# Patient Record
Sex: Male | Born: 1995 | Race: White | Hispanic: No | Marital: Married | Smoking: Current every day smoker
Health system: Southern US, Community
[De-identification: ages and names within clinical notes are randomized; demographics above are authoritative.]

## PROBLEM LIST (undated history)

## (undated) HISTORY — PX: TONSILLECTOMY AND ADENOIDECTOMY: SUR1326

## (undated) HISTORY — PX: TONSILLECTOMY: SUR1361

---
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
## 2017-03-05 ENCOUNTER — Encounter (HOSPITAL_COMMUNITY): Payer: Self-pay | Admitting: *Deleted

## 2017-03-05 ENCOUNTER — Emergency Department (HOSPITAL_COMMUNITY)
Admission: EM | Admit: 2017-03-05 | Discharge: 2017-03-05 | Disposition: A | Discharge status: Home / Self Care | Payer: Self-pay | Attending: Emergency Medicine | Admitting: Emergency Medicine

## 2017-03-05 ENCOUNTER — Emergency Department (HOSPITAL_COMMUNITY): Payer: Self-pay

## 2017-03-05 DIAGNOSIS — J029 Acute pharyngitis, unspecified: Secondary | ICD-10-CM | POA: Insufficient documentation

## 2017-03-05 DIAGNOSIS — J3489 Other specified disorders of nose and nasal sinuses: Secondary | ICD-10-CM | POA: Insufficient documentation

## 2017-03-05 DIAGNOSIS — R509 Fever, unspecified: Secondary | ICD-10-CM | POA: Insufficient documentation

## 2017-03-05 DIAGNOSIS — R05 Cough: Secondary | ICD-10-CM | POA: Insufficient documentation

## 2017-03-05 DIAGNOSIS — J111 Influenza due to unidentified influenza virus with other respiratory manifestations: Secondary | ICD-10-CM

## 2017-03-05 DIAGNOSIS — R69 Illness, unspecified: Secondary | ICD-10-CM | POA: Insufficient documentation

## 2017-03-05 DIAGNOSIS — R51 Headache: Secondary | ICD-10-CM | POA: Insufficient documentation

## 2017-03-05 DIAGNOSIS — F17220 Nicotine dependence, chewing tobacco, uncomplicated: Secondary | ICD-10-CM | POA: Insufficient documentation

## 2017-03-05 DIAGNOSIS — R42 Dizziness and giddiness: Secondary | ICD-10-CM | POA: Insufficient documentation

## 2017-03-05 LAB — RAPID STREP SCREEN (MED CTR MEBANE ONLY): Streptococcus, Group A Screen (Direct): NEGATIVE

## 2017-03-05 MED ORDER — IBUPROFEN 400 MG PO TABS
400.0000 mg | ORAL_TABLET | Freq: Once | ORAL | Status: AC
  Administered 2017-03-05: 400 mg via ORAL
  Filled 2017-03-05: qty 1

## 2017-03-05 NOTE — ED Triage Notes (Signed)
Pt c/o generalized body aches with right ear pain and dizziness x 3 days

## 2017-03-05 NOTE — ED Provider Notes (Signed)
[facility]-EMERGENCY DEPT Provider Note   CSN: [id number] Arrival date & time: 03/05/17  0015     History   Chief Complaint Chief Complaint  Patient presents with  . Generalized Body Aches    HPI Marc Porter is a 21 y.o. male.  Patient presents with a three-day history of generalized body aches, right ear pain, lightheadedness, headache, sore throat, rhinorrhea and cough. Has had sick contacts at home. Tmax Was reportedly 102. Denies any vomiting or diarrhea. Has not taken any medications at home. Denies any chronic medical conditions. Did not receive a flu shot this season. At this time feels pressure in his right ear with generalized congestion, sore throat and runny nose. Denies any chest pain, abdominal pain, nausea or vomiting.   The history is provided by the patient.    History reviewed. No pertinent past medical history.  There are no active problems to display for this patient.   Past Surgical History:  Procedure Laterality Date  . TONSILLECTOMY AND ADENOIDECTOMY         Home Medications    Prior to Admission medications   Not on File    Family History History reviewed. No pertinent family history.  Social History Social History  Substance Use Topics  . Smoking status: Light Tobacco Smoker  . Smokeless tobacco: Current User    Types: Chew  . Alcohol use Yes     Comment: occasionally     Allergies   Patient has no known allergies.   Review of Systems Review of Systems  Constitutional: Positive for activity change, appetite change, chills and fever.  HENT: Positive for congestion and rhinorrhea.   Respiratory: Positive for cough. Negative for chest tightness and shortness of breath.   Cardiovascular: Negative for chest pain.  Gastrointestinal: Negative for abdominal pain and nausea.  Genitourinary: Negative for dysuria, hematuria and testicular pain.  Musculoskeletal: Positive for arthralgias and myalgias.  Skin: Negative for wound.    Neurological: Positive for dizziness, weakness, light-headedness and headaches.    all other systems are negative except as noted in the HPI and PMH.    Physical Exam Updated Vital Signs BP 133/82 (BP Location: Right Arm)   Pulse (!) 106   Temp 98.7 F (37.1 C) (Oral)   Resp 20   Ht  (1.905 m)   Wt 79.4 kg (175 lb)   SpO2 100%   BMI 21.87 kg/m   Physical Exam  Constitutional: He is oriented to person, place, and time. He appears well-developed and well-nourished. No distress.  Nontoxic appearing  HENT:  Head: Normocephalic and atraumatic.  Mouth/Throat: Oropharynx is clear and moist. No oropharyngeal exudate.  Serous fluid behind TMs bilaterally  Eyes: Pupils are equal, round, and reactive to light. Conjunctivae and EOM are normal.  Neck: Normal range of motion. Neck supple.  No meningismus.  Cardiovascular: Normal rate, regular rhythm, normal heart sounds and intact distal pulses.   No murmur heard. Pulmonary/Chest: Effort normal and breath sounds normal. No respiratory distress. He exhibits no tenderness.  Abdominal: Soft. There is no tenderness. There is no rebound and no guarding.  Musculoskeletal: Normal range of motion. He exhibits no edema or tenderness.  Neurological: He is alert and oriented to person, place, and time. No cranial nerve deficit. He exhibits normal muscle tone. Coordination normal.   5/5 strength throughout. CN 2-12 intact.Equal grip strength.   Skin: Skin is warm.  Psychiatric: He has a normal mood and affect. His behavior is normal.  Nursing note and  vitals reviewed.    ED Treatments / Results  Labs (all labs ordered are listed, but only abnormal results are displayed) Labs Reviewed  RAPID STREP SCREEN (NOT AT [facility])  CULTURE, GROUP A STREP [facility])    EKG  EKG Interpretation None       Radiology Dg Chest 2 View  Result Date: 03/05/2017 CLINICAL DATA:  Generalized body aches.  Lightheadedness for 3 days. EXAM: CHEST  2 VIEW  COMPARISON:  None. FINDINGS: The lungs are clear. The pulmonary vasculature is normal. Heart size is normal. Hilar and mediastinal contours are unremarkable. There is no pleural effusion. IMPRESSION: No active cardiopulmonary disease. Electronically Signed   By: Ellery Plunk M.D.   On: 03/05/2017 01:39    Procedures Procedures (including critical care time)  Medications Ordered in ED Medications  ibuprofen (ADVIL,MOTRIN) tablet 400 mg (not administered)     Initial Impression / Assessment and Plan / ED Course  I have reviewed the triage vital signs and the nursing notes.  Pertinent labs & imaging results that were available during my care of the patient were reviewed by me and considered in my medical decision making (see chart for details).    Patient with 3 day history of right ear pain, generalized body aches, headache, sore throat, lightheadedness and rhinorrhea. Also with cough. Has had sick contacts at home  Patient appears nontoxic. He is afebrile. Lungs are clear. Oropharynx is clear. CXR negative. Rapid strep negative.  Suspect viral syndrome, possibly influenza. Discussed risks and benefits of Tamiflu which patient declines.  Discussed by mouth hydration at home, antipyretics, PCP follow-up, return precautions discussed  Final Clinical Impressions(s) / ED Diagnoses   Final diagnoses:  Influenza-like illness    New Prescriptions New Prescriptions   No medications on file     Glynn Octave, MD 03/05/17 [phone]

## 2017-03-07 LAB — CULTURE, GROUP A STREP (THRC)

## 2017-04-27 ENCOUNTER — Emergency Department (HOSPITAL_COMMUNITY)
Admission: EM | Admit: 2017-04-27 | Discharge: 2017-04-27 | Disposition: A | Discharge status: Home / Self Care | Payer: Self-pay | Attending: Emergency Medicine | Admitting: Emergency Medicine

## 2017-04-27 ENCOUNTER — Encounter (HOSPITAL_COMMUNITY): Payer: Self-pay | Admitting: Emergency Medicine

## 2017-04-27 DIAGNOSIS — X58XXXA Exposure to other specified factors, initial encounter: Secondary | ICD-10-CM | POA: Insufficient documentation

## 2017-04-27 DIAGNOSIS — S30812A Abrasion of penis, initial encounter: Secondary | ICD-10-CM | POA: Insufficient documentation

## 2017-04-27 DIAGNOSIS — Y929 Unspecified place or not applicable: Secondary | ICD-10-CM | POA: Insufficient documentation

## 2017-04-27 DIAGNOSIS — F17290 Nicotine dependence, other tobacco product, uncomplicated: Secondary | ICD-10-CM | POA: Insufficient documentation

## 2017-04-27 DIAGNOSIS — Y9389 Activity, other specified: Secondary | ICD-10-CM | POA: Insufficient documentation

## 2017-04-27 DIAGNOSIS — Y999 Unspecified external cause status: Secondary | ICD-10-CM | POA: Insufficient documentation

## 2017-04-27 NOTE — ED Notes (Signed)
Pt ambulatory to waiting room. Pt verbalized understanding of discharge instructions.   

## 2017-04-27 NOTE — Discharge Instructions (Signed)
Put antibiotic ointment on the abrasion, recheck if it gets infected (drains pus, gets more red or swollen), if you are unable to urinate.  No sex for a couple of days, and then use condoms.

## 2017-04-27 NOTE — ED Provider Notes (Signed)
[facility] EMERGENCY DEPARTMENT Provider Note   CSN: [id number] Arrival date & time: 04/27/17  0121  Time seen 02:08 AM   History   Chief Complaint Chief Complaint  Patient presents with  . Penis Pain    HPI Marc Porter is a 21 y.o. male.  HPI patient is here with his girlfriend.  She states last night he had some discomfort when they were having sex and he had a small abrasion on the side of his penis.  Tonight about midnight they were having sex and the abrasion got worse.  He states he had a little bleeding and it is improved.  His girlfriend states the penis is now swollen.  He states he was able to urinate without difficulty afterwards.  Denies having any discomfort in his penis prior to having sexual contact.  PCP none   History reviewed. No pertinent past medical history.  There are no active problems to display for this patient.   Past Surgical History:  Procedure Laterality Date  . TONSILLECTOMY AND ADENOIDECTOMY         Home Medications    Prior to Admission medications   Not on File    Family History No family history on file.  Social History Social History   Tobacco Use  . Smoking status: Light Tobacco Smoker  . Smokeless tobacco: Current User    Types: Chew  Substance Use Topics  . Alcohol use: Yes    Comment: occasionally  . Drug use: No     Allergies   Patient has no known allergies.   Review of Systems Review of Systems  All other systems reviewed and are negative.    Physical Exam Updated Vital Signs BP 139/86 (BP Location: Left Arm)   Pulse (!) 101   Temp 98 F (36.7 C) (Oral)   Resp 17   Ht 6\' 3"  (1.905 m)   Wt 81.6 kg (180 lb)   SpO2 100%   BMI 22.50 kg/m   Vital signs normal except for tachycardia   Physical Exam  Constitutional: He appears well-developed and well-nourished. No distress.  HENT:  Head: Normocephalic and atraumatic.  Right Ear: External ear normal.  Left Ear: External ear normal.    Nose: Nose normal.  Eyes: Conjunctivae and EOM are normal.  Neck: Normal range of motion.  Cardiovascular: Normal rate.  Pulmonary/Chest: Effort normal. No respiratory distress.  Genitourinary:  Genitourinary Comments: Patient is circumcised, he is noted to have a very superficial abrasion on the left side of his shaft just proximal to the head that is approximately 1 cm in length.  There is no active bleeding.  His penis is straight and without deformity or swelling.  Nursing note and vitals reviewed.    ED Treatments / Results  Labs (all labs ordered are listed, but only abnormal results are displayed) Labs Reviewed - No data to display  EKG  EKG Interpretation None       Radiology No results found.  Procedures Procedures (including critical care time)  Medications Ordered in ED Medications - No data to display   Initial Impression / Assessment and Plan / ED Course  I have reviewed the triage vital signs and the nursing notes.  Pertinent labs & imaging results that were available during my care of the patient were reviewed by me and considered in my medical decision making (see chart for details).    Patient has a superficial abrasion to the shaft of his penis.  I was concerned  he may have had some underlying herpes however he had no discomfort until he had the active sexual intercourse.  He has a superficial abrasion that should heal with just local care.  Final Clinical Impressions(s) / ED Diagnoses   Final diagnoses:  Abrasion of penis, initial encounter    ED Discharge Orders    None    antibiotic ointment  Plan discharge  Devoria AlbeIva Kaytie Ratcliffe, MD, Concha PyoFACEP    Shawntae Lowy, MD 04/27/17 [phone]

## 2017-04-27 NOTE — ED Triage Notes (Signed)
Pt states he was having sex with his girlfriend and think he ripped his foreskin.

## 2018-06-11 ENCOUNTER — Encounter (HOSPITAL_COMMUNITY): Payer: Self-pay | Admitting: Emergency Medicine

## 2018-06-11 ENCOUNTER — Other Ambulatory Visit: Payer: Self-pay

## 2018-06-11 ENCOUNTER — Emergency Department (HOSPITAL_COMMUNITY): Payer: Self-pay

## 2018-06-11 ENCOUNTER — Emergency Department (HOSPITAL_COMMUNITY)
Admission: EM | Admit: 2018-06-11 | Discharge: 2018-06-11 | Disposition: A | Discharge status: Home / Self Care | Payer: Self-pay | Attending: Emergency Medicine | Admitting: Emergency Medicine

## 2018-06-11 DIAGNOSIS — M545 Low back pain, unspecified: Secondary | ICD-10-CM

## 2018-06-11 DIAGNOSIS — Z720 Tobacco use: Secondary | ICD-10-CM | POA: Insufficient documentation

## 2018-06-11 LAB — CBC WITH DIFFERENTIAL/PLATELET
Abs Immature Granulocytes: 0.01 10*3/uL (ref 0.00–0.07)
BASOS ABS: 0 10*3/uL (ref 0.0–0.1)
Basophils Relative: 0 %
EOS ABS: 0.1 10*3/uL (ref 0.0–0.5)
EOS PCT: 1 %
HCT: 42.8 % (ref 39.0–52.0)
Hemoglobin: 14.5 g/dL (ref 13.0–17.0)
Immature Granulocytes: 0 %
Lymphocytes Relative: 42 %
Lymphs Abs: 1.9 10*3/uL (ref 0.7–4.0)
MCH: 29.2 pg (ref 26.0–34.0)
MCHC: 33.9 g/dL (ref 30.0–36.0)
MCV: 86.1 fL (ref 80.0–100.0)
Monocytes Absolute: 0.4 10*3/uL (ref 0.1–1.0)
Monocytes Relative: 10 %
NEUTROS PCT: 47 %
NRBC: 0 % (ref 0.0–0.2)
Neutro Abs: 2.2 10*3/uL (ref 1.7–7.7)
PLATELETS: 229 10*3/uL (ref 150–400)
RBC: 4.97 MIL/uL (ref 4.22–5.81)
RDW: 12.1 % (ref 11.5–15.5)
WBC: 4.7 10*3/uL (ref 4.0–10.5)

## 2018-06-11 LAB — COMPREHENSIVE METABOLIC PANEL
ALBUMIN: 4.4 g/dL (ref 3.5–5.0)
ALT: 35 U/L (ref 0–44)
ANION GAP: 11 (ref 5–15)
AST: 28 U/L (ref 15–41)
Alkaline Phosphatase: 77 U/L (ref 38–126)
BILIRUBIN TOTAL: 0.6 mg/dL (ref 0.3–1.2)
BUN: 12 mg/dL (ref 6–20)
CHLORIDE: 108 mmol/L (ref 98–111)
CO2: 21 mmol/L — AB (ref 22–32)
Calcium: 9.4 mg/dL (ref 8.9–10.3)
Creatinine, Ser: 0.85 mg/dL (ref 0.61–1.24)
GFR calc Af Amer: >60 mL/min (ref >60)
GFR calc non Af Amer: >60 mL/min (ref >60)
GLUCOSE: 126 mg/dL — AB (ref 70–99)
POTASSIUM: 3.3 mmol/L — AB (ref 3.5–5.1)
SODIUM: 140 mmol/L (ref 135–145)
TOTAL PROTEIN: 7.1 g/dL (ref 6.5–8.1)

## 2018-06-11 LAB — LIPASE, BLOOD: Lipase: 19 U/L (ref 11–51)

## 2018-06-11 LAB — URINALYSIS, ROUTINE W REFLEX MICROSCOPIC
BILIRUBIN URINE: NEGATIVE
Glucose, UA: NEGATIVE mg/dL
Hgb urine dipstick: NEGATIVE
KETONES UR: NEGATIVE mg/dL
LEUKOCYTES UA: NEGATIVE
NITRITE: NEGATIVE
PH: 6 (ref 5.0–8.0)
PROTEIN: NEGATIVE mg/dL
Specific Gravity, Urine: 1.027 (ref 1.005–1.030)

## 2018-06-11 MED ORDER — MORPHINE SULFATE (PF) 4 MG/ML IV SOLN
4.0000 mg | Freq: Once | INTRAVENOUS | Status: AC
  Administered 2018-06-11: 4 mg via INTRAVENOUS
  Filled 2018-06-11: qty 1

## 2018-06-11 MED ORDER — POTASSIUM CHLORIDE CRYS ER 20 MEQ PO TBCR
20.0000 meq | EXTENDED_RELEASE_TABLET | Freq: Once | ORAL | Status: AC
  Administered 2018-06-11: 20 meq via ORAL
  Filled 2018-06-11: qty 1

## 2018-06-11 MED ORDER — TRAMADOL HCL 50 MG PO TABS: 50.0000 mg | ORAL_TABLET | Freq: Four times a day (QID) | ORAL | 0 refills | Status: DC | PRN

## 2018-06-11 MED ORDER — IBUPROFEN 600 MG PO TABS: 600.0000 mg | ORAL_TABLET | Freq: Four times a day (QID) | ORAL | 0 refills | Status: DC | PRN

## 2018-06-11 MED ORDER — CYCLOBENZAPRINE HCL 10 MG PO TABS: 10.0000 mg | ORAL_TABLET | Freq: Two times a day (BID) | ORAL | 0 refills | Status: DC | PRN

## 2018-06-11 MED ORDER — ONDANSETRON HCL 4 MG/2ML IJ SOLN
4.0000 mg | Freq: Once | INTRAMUSCULAR | Status: AC
  Administered 2018-06-11: 4 mg via INTRAVENOUS
  Filled 2018-06-11: qty 2

## 2018-06-11 NOTE — ED Notes (Signed)
Pt returned from xray

## 2018-06-11 NOTE — Discharge Instructions (Addendum)
Take the medicines as directed.  Do not drive within 4 hours of taking tramadol or flexeril (muscle relaxer) as these will make you drowsy.  Avoid lifting,  Bending,  Twisting or any other activity that worsens your pain over the next week.  Apply a heating pad to your lower back for 20 minutes 3-4 times daily.  You should get rechecked if your symptoms are not better over the next 5 days,  Or you develop increased pain,  Weakness in your leg(s) or loss of bladder or bowel function - these are symptoms of a worsening condition.  Your Lumbar xrays are normal in appearance without obvious chronic injury findings.

## 2018-06-11 NOTE — ED Notes (Signed)
Patient transported to X-ray 

## 2018-06-11 NOTE — ED Provider Notes (Signed)
[facility] EMERGENCY DEPARTMENT Provider Note   CSN: [id number] Arrival date & time: 06/11/18  4098     History   Chief Complaint Chief Complaint  Patient presents with  . Back Pain    HPI Marc Porter is a 23 y.o. male with no significant past medical history presenting with sudden onset of right mid flank side which wraps around to his anterior abdomen.  The episode started acutely this morning.  He states he was sitting on his couch and reached for a potato chip when the pain started which is severe and constant and is associated with nausea but no emesis.  He reports lifting a riding lawn more yesterday and may have injured his back, but denies having any pain prior to this morning.  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  He has never been evaluated for his back issues.  He denies weakness or numbness in his legs, denies urinary or fecal incontinence or retention.  His pain today is definitely worsened with movement but is present at rest as well.  His pain is now generalized to his abdomen but is more pronounced in his lower mid abdomen.  No abdominal surgical history.  Denies history of kidney stones.  Has had no dysuria or hematuria, denies vomiting or diarrhea.  Also denies chest pain, shortness of breath or pleuritic pain.  He has had no medications prior to arrival.  The history is provided by the patient.    History reviewed. No pertinent past medical history.  There are no active problems to display for this patient.   Past Surgical History:  Procedure Laterality Date  . TONSILLECTOMY AND ADENOIDECTOMY          Home Medications    Prior to Admission medications   Medication Sig Start Date End Date Taking? Authorizing Provider  cyclobenzaprine (FLEXERIL) 10 MG tablet Take 1 tablet (10 mg total) by mouth 2 (two) times daily as needed for muscle spasms. 06/11/18   Burgess Amor, PA-C  ibuprofen (ADVIL,MOTRIN) 600 MG tablet Take 1 tablet (600 mg total) by mouth every 6 (six) hours as needed. 06/11/18   Burgess Amor, PA-C  traMADol (ULTRAM) 50 MG tablet Take 1 tablet (50 mg total) by mouth every 6 (six) hours as needed. 06/11/18   Burgess Amor, PA-C    Family History History reviewed. No pertinent family history.  Social History Social History   Tobacco Use  . Smoking status: Light Tobacco Smoker  . Smokeless tobacco: Current User    Types: Chew  Substance Use Topics  . Alcohol use: Yes    Comment: occasionally  . Drug use: No     Allergies   Patient has no known allergies.   Review of Systems Review of Systems  Constitutional: Positive for diaphoresis. Negative for fever.  HENT: Negative for congestion and sore throat.   Eyes: Negative.   Respiratory: Negative for chest tightness and shortness of breath.   Cardiovascular: Negative for chest pain.  Gastrointestinal: Positive for abdominal pain and nausea. Negative for constipation, diarrhea and vomiting.       Flank pain  Genitourinary: Negative.   Musculoskeletal: Positive for back pain. Negative for arthralgias, joint swelling and neck pain.  Skin: Negative.  Negative for rash and wound.  Neurological: Negative for dizziness, weakness, light-headedness, numbness and headaches.  Psychiatric/Behavioral: Negative.      Physical Exam Updated  Vital Signs BP 116/69 (BP Location: Right Arm)   Pulse 79   Temp 98.3 F (36.8 C) (Oral)   Resp 15   Ht 6\' 1"  (1.854 m)   Wt 93 kg   SpO2 100%   BMI 27.05 kg/m   Physical Exam Vitals signs and nursing note reviewed.  Constitutional:      Appearance: He is well-developed.     Comments: Appears pale.  No diaphoresis.  HENT:     Head: Normocephalic and atraumatic.  Eyes:     Conjunctiva/sclera: Conjunctivae normal.  Neck:     Musculoskeletal: Normal range of motion.  Cardiovascular:     Rate and Rhythm: Normal rate and regular rhythm.      Heart sounds: Normal heart sounds.  Pulmonary:     Effort: Pulmonary effort is normal.     Breath sounds: Normal breath sounds. No wheezing.  Abdominal:     General: Bowel sounds are normal.     Palpations: Abdomen is soft.     Tenderness: There is generalized abdominal tenderness. There is no right CVA tenderness, left CVA tenderness, guarding or rebound. Negative signs include Murphy's sign.     Comments: Patient with generalized abdominal pain and right lateral side pain to palpation.  There is no guarding or rebound.  No specific CVA tenderness.  No distention, normal bowel sounds.  Musculoskeletal: Normal range of motion.     Thoracic back: He exhibits no bony tenderness.     Lumbar back: He exhibits no bony tenderness.  Skin:    General: Skin is warm and dry.  Neurological:     Mental Status: He is alert.      ED Treatments / Results  Labs (all labs ordered are listed, but only abnormal results are displayed) Labs Reviewed  COMPREHENSIVE METABOLIC PANEL - Abnormal; Notable for the following components:      Result Value   Potassium 3.3 (*)    CO2 21 (*)    Glucose, Bld 126 (*)    All other components within normal limits  CBC WITH DIFFERENTIAL/PLATELET  LIPASE, BLOOD  URINALYSIS, ROUTINE W REFLEX MICROSCOPIC    EKG None  Radiology Dg Lumbar Spine Complete  Result Date: 06/11/2018 CLINICAL DATA:  Lumbar pain EXAM: LUMBAR SPINE - COMPLETE 4+ VIEW COMPARISON:  None. FINDINGS: There is no evidence of lumbar spine fracture. Alignment is normal. Intervertebral disc spaces are maintained. IMPRESSION: Negative. Electronically Signed   By: Elige KoHetal  Patel   On: 06/11/2018 11:28    Procedures Procedures (including critical care time)  Medications Ordered in ED Medications  ondansetron (ZOFRAN) injection 4 mg (4 mg Intravenous Given 06/11/18 0856)  morphine 4 MG/ML injection 4 mg (4 mg Intravenous Given 06/11/18 0857)  potassium chloride SA (K-DUR,KLOR-CON) CR tablet 20 mEq  (20 mEq Oral Given 06/11/18 1040)     Initial Impression / Assessment and Plan / ED Course  I have reviewed the triage vital signs and the nursing notes.  Pertinent labs & imaging results that were available during my care of the patient were reviewed by me and considered in my medical decision making (see chart for details).     Labs reviewed and are negative for any obvious intra-abdominal process including a normal urinalysis without hemoglobin, doubt that this is a ureteral stone, especially given reproducible symptoms.  He was given IV fluids along with morphine and Zofran.  He was symptom-free after this except with movement.  He was sent for a lumbar spine plain x-ray to  rule out degenerative changes and or DDD.  Imaging was negative.  Patient was treated for musculoskeletal pain and given referrals for obtaining a PCP.  Final Clinical Impressions(s) / ED Diagnoses   Final diagnoses:  Acute right-sided low back pain without sciatica    ED Discharge Orders         Ordered    cyclobenzaprine (FLEXERIL) 10 MG tablet  2 times daily PRN     06/11/18 1141    ibuprofen (ADVIL,MOTRIN) 600 MG tablet  Every 6 hours PRN     06/11/18 1141    traMADol (ULTRAM) 50 MG tablet  Every 6 hours PRN     06/11/18 1141           Burgess Amor, PA-C 06/11/18 1144    Vanetta Mulders, MD 06/11/18 1446

## 2018-06-11 NOTE — ED Triage Notes (Signed)
Pt picked up a push mover yesterday.  Woke up this 0730 am with lower back pain radiating to lower abdominal area.  Denied urinary symptoms

## 2019-08-21 ENCOUNTER — Other Ambulatory Visit: Payer: Self-pay

## 2019-08-21 ENCOUNTER — Encounter (HOSPITAL_COMMUNITY): Payer: Self-pay

## 2019-08-21 ENCOUNTER — Emergency Department (HOSPITAL_COMMUNITY): Payer: Self-pay

## 2019-08-21 ENCOUNTER — Emergency Department (HOSPITAL_COMMUNITY)
Admission: EM | Admit: 2019-08-21 | Discharge: 2019-08-21 | Disposition: A | Discharge status: Home / Self Care | Payer: Self-pay | Attending: Emergency Medicine | Admitting: Emergency Medicine

## 2019-08-21 DIAGNOSIS — R519 Headache, unspecified: Secondary | ICD-10-CM | POA: Insufficient documentation

## 2019-08-21 DIAGNOSIS — Z79899 Other long term (current) drug therapy: Secondary | ICD-10-CM | POA: Insufficient documentation

## 2019-08-21 DIAGNOSIS — F17210 Nicotine dependence, cigarettes, uncomplicated: Secondary | ICD-10-CM | POA: Insufficient documentation

## 2019-08-21 DIAGNOSIS — F1210 Cannabis abuse, uncomplicated: Secondary | ICD-10-CM | POA: Insufficient documentation

## 2019-08-21 DIAGNOSIS — R55 Syncope and collapse: Secondary | ICD-10-CM | POA: Insufficient documentation

## 2019-08-21 LAB — CBC WITH DIFFERENTIAL/PLATELET
Abs Immature Granulocytes: 0.03 10*3/uL (ref 0.00–0.07)
Basophils Absolute: 0 10*3/uL (ref 0.0–0.1)
Basophils Relative: 0 %
Eosinophils Absolute: 0.1 10*3/uL (ref 0.0–0.5)
Eosinophils Relative: 1 %
HCT: 41.9 % (ref 39.0–52.0)
Hemoglobin: 14.4 g/dL (ref 13.0–17.0)
Immature Granulocytes: 0 %
Lymphocytes Relative: 20 %
Lymphs Abs: 1.4 10*3/uL (ref 0.7–4.0)
MCH: 30.6 pg (ref 26.0–34.0)
MCHC: 34.4 g/dL (ref 30.0–36.0)
MCV: 89 fL (ref 80.0–100.0)
Monocytes Absolute: 0.5 10*3/uL (ref 0.1–1.0)
Monocytes Relative: 7 %
Neutro Abs: 5.2 10*3/uL (ref 1.7–7.7)
Neutrophils Relative %: 72 %
Platelets: 234 10*3/uL (ref 150–400)
RBC: 4.71 MIL/uL (ref 4.22–5.81)
RDW: 11.9 % (ref 11.5–15.5)
WBC: 7.2 10*3/uL (ref 4.0–10.5)
nRBC: 0 % (ref 0.0–0.2)

## 2019-08-21 LAB — COMPREHENSIVE METABOLIC PANEL
ALT: 24 U/L (ref 0–44)
AST: 17 U/L (ref 15–41)
Albumin: 4.4 g/dL (ref 3.5–5.0)
Alkaline Phosphatase: 91 U/L (ref 38–126)
Anion gap: 9 (ref 5–15)
BUN: 13 mg/dL (ref 6–20)
CO2: 26 mmol/L (ref 22–32)
Calcium: 9.6 mg/dL (ref 8.9–10.3)
Chloride: 103 mmol/L (ref 98–111)
Creatinine, Ser: 0.87 mg/dL (ref 0.61–1.24)
GFR calc Af Amer: >60 mL/min (ref >60)
GFR calc non Af Amer: >60 mL/min (ref >60)
Glucose, Bld: 94 mg/dL (ref 70–99)
Potassium: 4.1 mmol/L (ref 3.5–5.1)
Sodium: 138 mmol/L (ref 135–145)
Total Bilirubin: 0.5 mg/dL (ref 0.3–1.2)
Total Protein: 7.3 g/dL (ref 6.5–8.1)

## 2019-08-21 NOTE — ED Provider Notes (Signed)
[facility] EMERGENCY DEPARTMENT Provider Note   CSN: [id number] Arrival date & time: 08/21/19  1853     History Chief Complaint  Patient presents with  . Loss of Consciousness    Marc Porter is a 24 y.o. male.  The history is provided by the patient. No language interpreter was used.  Loss of Consciousness Episode history:  Multiple Most recent episode:  Today Timing:  Intermittent Progression:  Worsening Chronicity:  Recurrent Context: urination   Witnessed: yes   Relieved by:  Nothing Worsened by:  Nothing Ineffective treatments:  None tried Associated symptoms: headaches   Associated symptoms: no chest pain   Risk factors: no congenital heart disease and no coronary artery disease   Pt reports he passed out yesterday while urinating.  Pt reports this has happened in the past.  Pt reports he hit his head and busted his lip.  Pt reports this has happened in the past.  Pt reports episodes with standing and urinating      History reviewed. No pertinent past medical history.  There are no problems to display for this patient.   Past Surgical History:  Procedure Laterality Date  . TONSILLECTOMY    . TONSILLECTOMY AND ADENOIDECTOMY         History reviewed. No pertinent family history.  Social History   Tobacco Use  . Smoking status: Current Every Day Smoker    Packs/day: 0.50  . Smokeless tobacco: Former Systems developer    Types: Chew  Substance Use Topics  . Alcohol use: Yes    Comment: occasionally  . Drug use: Yes    Types: Marijuana    Home Medications Prior to Admission medications   Medication Sig Start Date End Date Taking? Authorizing Provider  ibuprofen (ADVIL,MOTRIN) 600 MG tablet Take 1 tablet (600 mg total) by mouth every 6 (six) hours as needed. 06/11/18  Yes Idol, Almyra Free, PA-C  cyclobenzaprine (FLEXERIL) 10 MG tablet Take 1 tablet (10 mg total) by mouth 2 (two) times daily as needed for muscle spasms. 06/11/18   Evalee Jefferson, PA-C  traMADol  (ULTRAM) 50 MG tablet Take 1 tablet (50 mg total) by mouth every 6 (six) hours as needed. 06/11/18   Evalee Jefferson, PA-C    Allergies    Patient has no known allergies.  Review of Systems   Review of Systems  Cardiovascular: Positive for syncope. Negative for chest pain.  Neurological: Positive for headaches.  All other systems reviewed and are negative.   Physical Exam Updated Vital Signs BP 114/73   Pulse 77   Temp 98 F (36.7 C) (Oral)   Resp 16   Ht 6' (1.829 m)   Wt 96.2 kg   SpO2 99%   BMI 28.75 kg/m   Physical Exam Vitals and nursing note reviewed.  Constitutional:      Appearance: He is well-developed.  HENT:     Head: Normocephalic and atraumatic.  Eyes:     Conjunctiva/sclera: Conjunctivae normal.  Cardiovascular:     Rate and Rhythm: Normal rate and regular rhythm.     Heart sounds: No murmur.  Pulmonary:     Effort: Pulmonary effort is normal. No respiratory distress.     Breath sounds: Normal breath sounds.  Abdominal:     Palpations: Abdomen is soft.     Tenderness: There is no abdominal tenderness.  Musculoskeletal:     Cervical back: Neck supple.  Skin:    General: Skin is warm and dry.  Neurological:  General: No focal deficit present.     Mental Status: He is alert.  Psychiatric:        Mood and Affect: Mood normal.     ED Results / Procedures / Treatments   Labs (all labs ordered are listed, but only abnormal results are displayed) Labs Reviewed  CBC WITH DIFFERENTIAL/PLATELET  COMPREHENSIVE METABOLIC PANEL    EKG None  Radiology CT Head Wo Contrast  Result Date: 08/21/2019 CLINICAL DATA:  Head trauma EXAM: CT HEAD WITHOUT CONTRAST TECHNIQUE: Contiguous axial images were obtained from the base of the skull through the vertex without intravenous contrast. COMPARISON:  None. FINDINGS: Brain: No acute territorial infarction, hemorrhage or intracranial mass. The ventricles are nonenlarged Vascular: No hyperdense vessel or unexpected  calcification. Skull: Normal. Negative for fracture or focal lesion. Sinuses/Orbits: Mucosal thickening in the right ethmoid sinus Other: None IMPRESSION: Negative non contrasted CT appearance of the brain Electronically Signed   By: Jasmine Pang M.D.   On: 08/21/2019 21:07    Procedures Procedures (including critical care time)  Medications Ordered in ED Medications - No data to display  ED Course  I have reviewed the triage vital signs and the nursing notes.  Pertinent labs & imaging results that were available during my care of the patient were reviewed by me and considered in my medical decision making (see chart for details).    MDM Rules/Calculators/A&P                      MDM: labs are normal  Ct head is normal   Pt counseled on syncope and precautions  Final Clinical Impression(s) / ED Diagnoses Final diagnoses:  Micturition syncope    Rx / DC Orders ED Discharge Orders    None    An After Visit Summary was printed and given to the patient.    Elson Areas, Cordelia Poche 08/21/19 2251    Bethann Berkshire, MD 08/22/19 1036

## 2019-08-21 NOTE — Discharge Instructions (Addendum)
Drink plenty of fluids.  Make sure to stand slowly.

## 2019-08-21 NOTE — ED Triage Notes (Signed)
Pt c/o of syncopal episodes that began last night while pt was urinating. Pt states he blacked out and busted lip on toilet seat. Pt states he had a near syncopal episode today but sat down and avoided passing out completely.   Pt endorses migraine at this time.

## 2021-01-19 ENCOUNTER — Emergency Department (HOSPITAL_COMMUNITY): Payer: Self-pay

## 2021-01-19 ENCOUNTER — Emergency Department (HOSPITAL_COMMUNITY)
Admission: EM | Admit: 2021-01-19 | Discharge: 2021-01-19 | Disposition: A | Discharge status: Home / Self Care | Payer: Self-pay | Attending: Emergency Medicine | Admitting: Emergency Medicine

## 2021-01-19 ENCOUNTER — Encounter (HOSPITAL_COMMUNITY): Payer: Self-pay | Admitting: Emergency Medicine

## 2021-01-19 ENCOUNTER — Other Ambulatory Visit: Payer: Self-pay

## 2021-01-19 DIAGNOSIS — W208XXA Other cause of strike by thrown, projected or falling object, initial encounter: Secondary | ICD-10-CM | POA: Insufficient documentation

## 2021-01-19 DIAGNOSIS — F17210 Nicotine dependence, cigarettes, uncomplicated: Secondary | ICD-10-CM | POA: Insufficient documentation

## 2021-01-19 DIAGNOSIS — M79674 Pain in right toe(s): Secondary | ICD-10-CM | POA: Insufficient documentation

## 2021-01-19 MED ORDER — OXYCODONE-ACETAMINOPHEN 5-325 MG PO TABS
2.0000 | ORAL_TABLET | Freq: Once | ORAL | Status: AC
  Administered 2021-01-19: 2 via ORAL
  Filled 2021-01-19: qty 2

## 2021-01-19 MED ORDER — KETOROLAC TROMETHAMINE 60 MG/2ML IM SOLN
60.0000 mg | Freq: Once | INTRAMUSCULAR | Status: AC
  Administered 2021-01-19: 60 mg via INTRAMUSCULAR
  Filled 2021-01-19: qty 2

## 2021-01-19 NOTE — ED Provider Notes (Signed)
[facility] EMERGENCY DEPARTMENT Provider Note   CSN: [id number] Arrival date & time: 01/19/21  4098     History Chief Complaint  Patient presents with   Foot Pain    Marc Porter is a 25 y.o. male.  Dropped heavy object on right big toe. Pain and bruising since then. Tried OTC meds with mild relief.    Foot Pain This is a new problem. The current episode started 3 to 5 hours ago. The problem occurs constantly. The problem has not changed since onset.Pertinent negatives include no chest pain and no abdominal pain. Nothing aggravates the symptoms. Nothing relieves the symptoms. He has tried nothing for the symptoms.      History reviewed. No pertinent past medical history.  There are no problems to display for this patient.   Past Surgical History:  Procedure Laterality Date   TONSILLECTOMY     TONSILLECTOMY AND ADENOIDECTOMY         No family history on file.  Social History   Tobacco Use   Smoking status: Every Day    Packs/day: 0.50    Types: Cigarettes   Smokeless tobacco: Former    Types: Chew  Substance Use Topics   Alcohol use: Not Currently    Comment: occasionally   Drug use: Not Currently    Types: Marijuana    Home Medications Prior to Admission medications   Medication Sig Start Date End Date Taking? Authorizing Provider  cyclobenzaprine (FLEXERIL) 10 MG tablet Take 1 tablet (10 mg total) by mouth 2 (two) times daily as needed for muscle spasms. 06/11/18   Burgess Amor, PA-C  ibuprofen (ADVIL,MOTRIN) 600 MG tablet Take 1 tablet (600 mg total) by mouth every 6 (six) hours as needed. 06/11/18   Burgess Amor, PA-C  traMADol (ULTRAM) 50 MG tablet Take 1 tablet (50 mg total) by mouth every 6 (six) hours as needed. 06/11/18   Burgess Amor, PA-C    Allergies    Patient has no known allergies.  Review of Systems   Review of Systems  Cardiovascular:  Negative for chest pain.  Gastrointestinal:  Negative for abdominal pain.  All other systems  reviewed and are negative.  Physical Exam Updated Vital Signs BP 130/81   Pulse (!) 56   Temp 97.9 F (36.6 C)   Resp 18   Ht 6' (1.829 m)   Wt 96 kg   SpO2 100%   BMI 28.70 kg/m   Physical Exam Vitals and nursing note reviewed.  Constitutional:      Appearance: He is well-developed.  HENT:     Head: Normocephalic and atraumatic.     Mouth/Throat:     Mouth: Mucous membranes are moist.     Pharynx: Oropharynx is clear.  Eyes:     Conjunctiva/sclera: Conjunctivae normal.     Pupils: Pupils are equal, round, and reactive to light.  Cardiovascular:     Rate and Rhythm: Normal rate.  Pulmonary:     Effort: Pulmonary effort is normal. No respiratory distress.  Abdominal:     General: There is no distension.  Musculoskeletal:        General: Tenderness (right big toe near proximal toenail) present. Normal range of motion.     Cervical back: Normal range of motion.  Skin:    General: Skin is warm and dry.  Neurological:     General: No focal deficit present.     Mental Status: He is alert.    ED Results / Procedures / Treatments  Labs (all labs ordered are listed, but only abnormal results are displayed) Labs Reviewed - No data to display  EKG None  Radiology DG Toe Great Right  Result Date: 01/19/2021 CLINICAL DATA:  Posttraumatic pain and swelling to the great toe. EXAM: RIGHT GREAT TOE COMPARISON:  None. FINDINGS: There is no evidence of fracture or dislocation. There is no evidence of arthropathy or other focal bone abnormality. Soft tissues are unremarkable. IMPRESSION: Negative. Electronically Signed   By: Marnee Spring M.D.   On: 01/19/2021 04:10    Procedures Procedures   Medications Ordered in ED Medications  ketorolac (TORADOL) injection 60 mg (60 mg Intramuscular Given 01/19/21 0346)  oxyCODONE-acetaminophen (PERCOCET/ROXICET) 5-325 MG per tablet 2 tablet (2 tablets Oral Given 01/19/21 0441)    ED Course  I have reviewed the triage vital signs  and the nursing notes.  Pertinent labs & imaging results that were available during my care of the patient were reviewed by me and considered in my medical decision making (see chart for details).    MDM Rules/Calculators/A&P                         No fracture, symptomatic care at home. No indication for trephination.   Final Clinical Impression(s) / ED Diagnoses Final diagnoses:  Pain of toe of right foot    Rx / DC Orders ED Discharge Orders     None        Aislee Landgren, Barbara Cower, MD 01/19/21 [phone]

## 2021-01-19 NOTE — ED Triage Notes (Signed)
Pt c/o right great toe pain after dropping picture frame on it last night.

## 2021-10-21 ENCOUNTER — Encounter (HOSPITAL_COMMUNITY): Payer: Self-pay | Admitting: *Deleted

## 2021-10-21 ENCOUNTER — Emergency Department (HOSPITAL_COMMUNITY)
Admission: EM | Admit: 2021-10-21 | Discharge: 2021-10-21 | Disposition: A | Discharge status: Home / Self Care | Payer: Self-pay | Attending: Emergency Medicine | Admitting: Emergency Medicine

## 2021-10-21 ENCOUNTER — Other Ambulatory Visit: Payer: Self-pay

## 2021-10-21 DIAGNOSIS — Z20822 Contact with and (suspected) exposure to covid-19: Secondary | ICD-10-CM | POA: Insufficient documentation

## 2021-10-21 DIAGNOSIS — R112 Nausea with vomiting, unspecified: Secondary | ICD-10-CM | POA: Insufficient documentation

## 2021-10-21 DIAGNOSIS — R109 Unspecified abdominal pain: Secondary | ICD-10-CM | POA: Insufficient documentation

## 2021-10-21 DIAGNOSIS — R6883 Chills (without fever): Secondary | ICD-10-CM | POA: Insufficient documentation

## 2021-10-21 DIAGNOSIS — R519 Headache, unspecified: Secondary | ICD-10-CM | POA: Insufficient documentation

## 2021-10-21 DIAGNOSIS — R197 Diarrhea, unspecified: Secondary | ICD-10-CM | POA: Insufficient documentation

## 2021-10-21 LAB — CBC
HCT: 42.8 % (ref 39.0–52.0)
Hemoglobin: 14.8 g/dL (ref 13.0–17.0)
MCH: 30.9 pg (ref 26.0–34.0)
MCHC: 34.6 g/dL (ref 30.0–36.0)
MCV: 89.4 fL (ref 80.0–100.0)
Platelets: 237 10*3/uL (ref 150–400)
RBC: 4.79 MIL/uL (ref 4.22–5.81)
RDW: 12.4 % (ref 11.5–15.5)
WBC: 4.5 10*3/uL (ref 4.0–10.5)
nRBC: 0 % (ref 0.0–0.2)

## 2021-10-21 LAB — COMPREHENSIVE METABOLIC PANEL
ALT: 26 U/L (ref 0–44)
AST: 18 U/L (ref 15–41)
Albumin: 4.2 g/dL (ref 3.5–5.0)
Alkaline Phosphatase: 74 U/L (ref 38–126)
Anion gap: 6 (ref 5–15)
BUN: 10 mg/dL (ref 6–20)
CO2: 27 mmol/L (ref 22–32)
Calcium: 9.5 mg/dL (ref 8.9–10.3)
Chloride: 108 mmol/L (ref 98–111)
Creatinine, Ser: 0.91 mg/dL (ref 0.61–1.24)
GFR, Estimated: >60 mL/min (ref >60)
Glucose, Bld: 92 mg/dL (ref 70–99)
Potassium: 4.3 mmol/L (ref 3.5–5.1)
Sodium: 141 mmol/L (ref 135–145)
Total Bilirubin: 0.4 mg/dL (ref 0.3–1.2)
Total Protein: 6.8 g/dL (ref 6.5–8.1)

## 2021-10-21 LAB — SARS CORONAVIRUS 2 BY RT PCR: SARS Coronavirus 2 by RT PCR: NEGATIVE

## 2021-10-21 LAB — URINALYSIS, ROUTINE W REFLEX MICROSCOPIC
Bilirubin Urine: NEGATIVE
Glucose, UA: NEGATIVE mg/dL
Hgb urine dipstick: NEGATIVE
Ketones, ur: NEGATIVE mg/dL
Leukocytes,Ua: NEGATIVE
Nitrite: NEGATIVE
Protein, ur: NEGATIVE mg/dL
Specific Gravity, Urine: 1.011 (ref 1.005–1.030)
pH: 7 (ref 5.0–8.0)

## 2021-10-21 LAB — LIPASE, BLOOD: Lipase: 21 U/L (ref 11–51)

## 2021-10-21 MED ORDER — SODIUM CHLORIDE 0.9 % IV BOLUS
1000.0000 mL | Freq: Once | INTRAVENOUS | Status: AC
Start: 2021-10-21 — End: 2021-10-21
  Administered 2021-10-21: 1000 mL via INTRAVENOUS

## 2021-10-21 MED ORDER — ONDANSETRON HCL 4 MG/2ML IJ SOLN
4.0000 mg | Freq: Once | INTRAMUSCULAR | Status: AC
Start: 2021-10-21 — End: 2021-10-21
  Administered 2021-10-21: 4 mg via INTRAVENOUS
  Filled 2021-10-21: qty 2

## 2021-10-21 MED ORDER — ONDANSETRON 4 MG PO TBDP: 4.0000 mg | ORAL_TABLET | Freq: Three times a day (TID) | ORAL | 0 refills | Status: DC | PRN

## 2021-10-21 NOTE — ED Triage Notes (Signed)
Pt in c/o generalized body aches, c/o nausea & diarrhea with x 10 in the last 24 hours, pt c/o headache, c/o chills and fever, pt reports that his partner was sick prior to him being sick, A&O x4

## 2021-10-21 NOTE — ED Provider Notes (Signed)
[facility] EMERGENCY DEPARTMENT Provider Note   CSN: [id number] Arrival date & time: 10/21/21  1830     History {Add pertinent medical, surgical, social history, OB history to HPI:1} Chief Complaint  Patient presents with   Abdominal Pain    Marc Porter is a 26 y.o. male.   Abdominal Pain Patient presents with body aches nausea and diarrhea.  Has had for around last 24 hours.  Dull headache.  Some chills.  Patient's partner has had similar symptoms.  States everything even stressed,.  Some dull abdominal cramping.  No blood in the emesis or diarrhea.   No past medical history on file.  Home Medications Prior to Admission medications   Medication Sig Start Date End Date Taking? Authorizing Provider  cyclobenzaprine (FLEXERIL) 10 MG tablet Take 1 tablet (10 mg total) by mouth 2 (two) times daily as needed for muscle spasms. 06/11/18   Burgess Amor, PA-C  ibuprofen (ADVIL,MOTRIN) 600 MG tablet Take 1 tablet (600 mg total) by mouth every 6 (six) hours as needed. 06/11/18   Burgess Amor, PA-C  traMADol (ULTRAM) 50 MG tablet Take 1 tablet (50 mg total) by mouth every 6 (six) hours as needed. 06/11/18   Burgess Amor, PA-C      Allergies    Patient has no known allergies.    Review of Systems   Review of Systems  Gastrointestinal:  Positive for abdominal pain.   Physical Exam Updated Vital Signs BP 138/88 (BP Location: Right Arm)   Pulse 97   Temp 98.6 F (37 C) (Oral)   Resp 18   Ht 6' (1.829 m)   SpO2 100%   BMI 28.70 kg/m  Physical Exam Constitutional:      Appearance: He is well-developed.  Cardiovascular:     Rate and Rhythm: Regular rhythm.  Pulmonary:     Effort: Pulmonary effort is normal.  Abdominal:     Comments: Minimal abdominal tenderness without rebound or guarding.  No hernia palpated.  Skin:    General: Skin is warm.     Capillary Refill: Capillary refill takes less than 2 seconds.  Neurological:     Mental Status: He is alert.    ED Results /  Procedures / Treatments   Labs (all labs ordered are listed, but only abnormal results are displayed) Labs Reviewed  URINALYSIS, ROUTINE W REFLEX MICROSCOPIC - Abnormal; Notable for the following components:      Result Value   APPearance CLOUDY (*)    All other components within normal limits  SARS CORONAVIRUS 2 BY RT PCR  LIPASE, BLOOD  COMPREHENSIVE METABOLIC PANEL  CBC    EKG None  Radiology No results found.  Procedures Procedures  {Document cardiac monitor, telemetry assessment procedure when appropriate:1}  Medications Ordered in ED Medications  sodium chloride 0.9 % bolus 1,000 mL (0 mLs Intravenous Stopped 10/21/21 2204)  ondansetron (ZOFRAN) injection 4 mg (4 mg Intravenous Given 10/21/21 2031)    ED Course/ Medical Decision Making/ A&P                           Medical Decision Making Amount and/or Complexity of Data Reviewed Labs: ordered.  Risk Prescription drug management.   ***  {Document critical care time when appropriate:1} {Document review of labs and clinical decision tools ie heart score, Chads2Vasc2 etc:1}  {Document your independent review of radiology images, and any outside records:1} {Document your discussion with family members, caretakers, and with consultants:1} {Document  social determinants of health affecting pt's care:1} {Document your decision making why or why not admission, treatments were needed:1} Final Clinical Impression(s) / ED Diagnoses Final diagnoses:  None    Rx / DC Orders ED Discharge Orders     None

## 2022-01-20 IMAGING — CT CT HEAD W/O CM
3 of 4 series · 15 of 47 positions shown, 18 images · non-contrast
Comparison: None.

CLINICAL DATA: Head trauma

EXAM:
CT HEAD WITHOUT CONTRAST
TECHNIQUE: Contiguous axial images were obtained from the base of the skull
through the vertex without intravenous contrast.

[Series 2: head w o · axial · 0.42mm/px · z∈[+37,+177]mm · 9 of 34 slices shown, 12 images]
[im 3/34  brain]
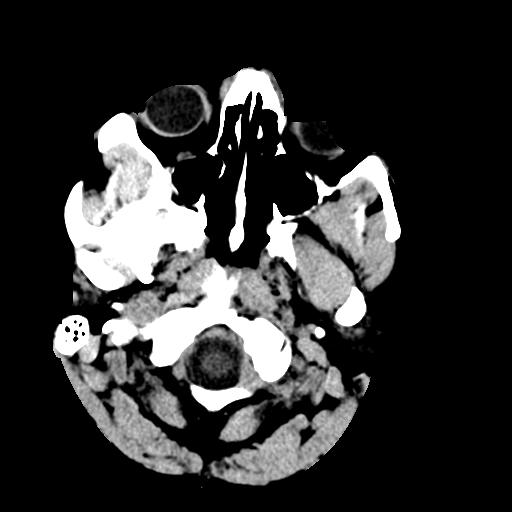
[im 3/34  bone]
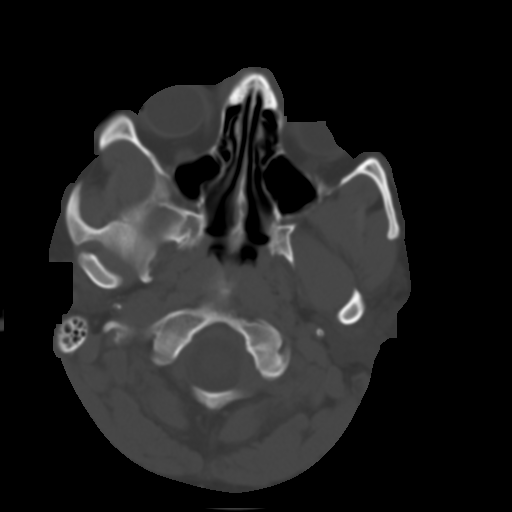
[im 8/34  brain]
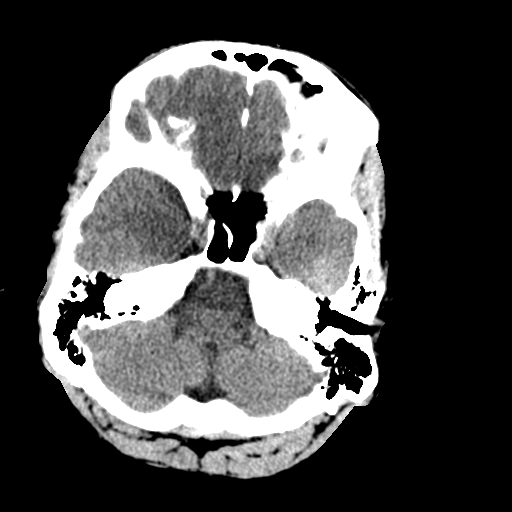
[im 10/34  brain]
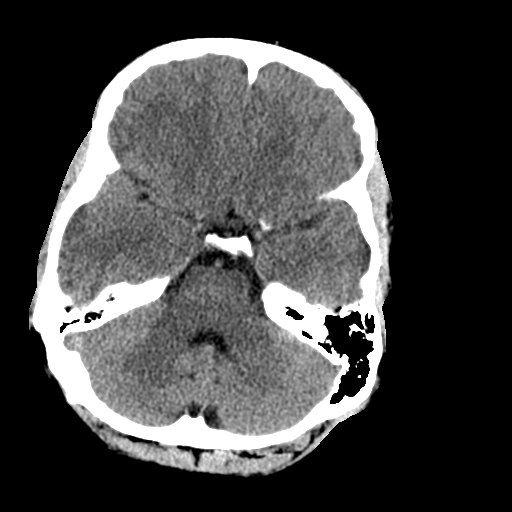
[im 15/34  brain]
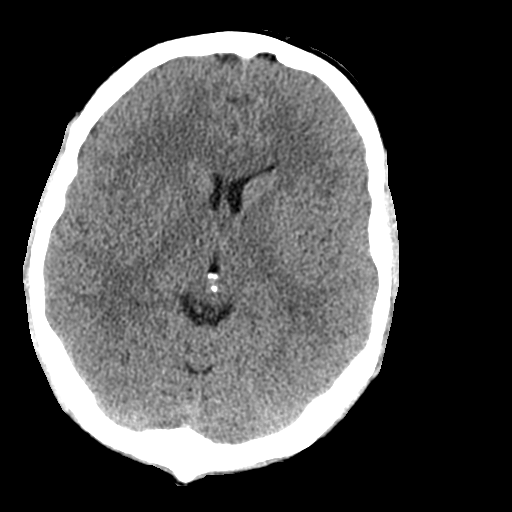
[im 17/34  brain]
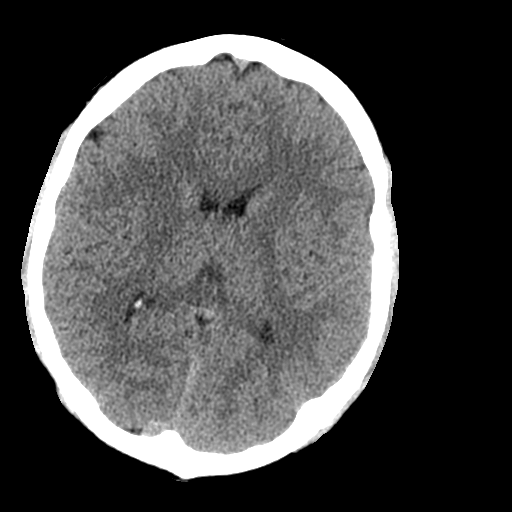
[im 17/34  bone]
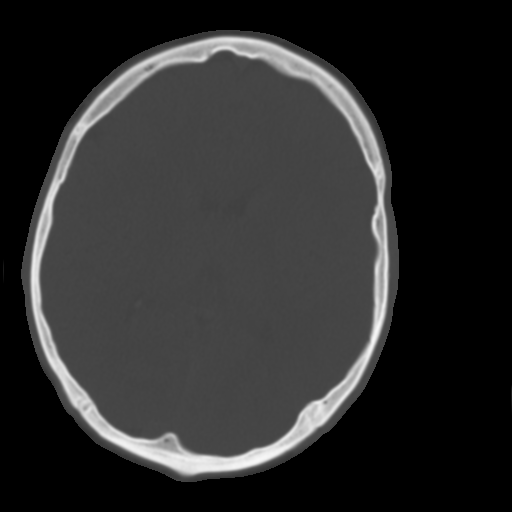
[im 19/34  brain]
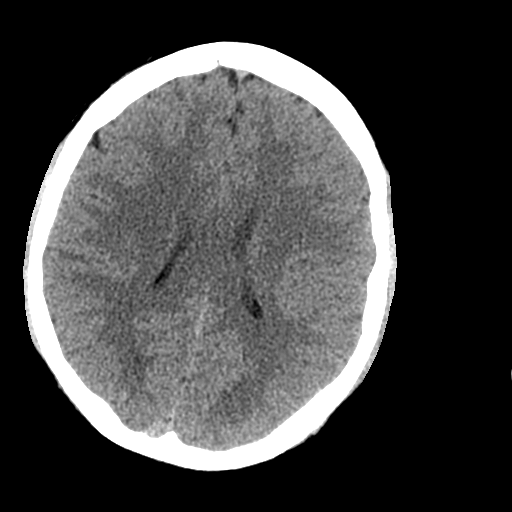
[im 24/34  brain]
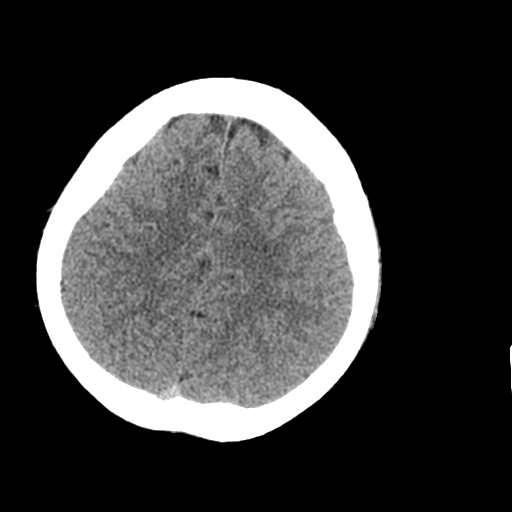
[im 26/34  brain]
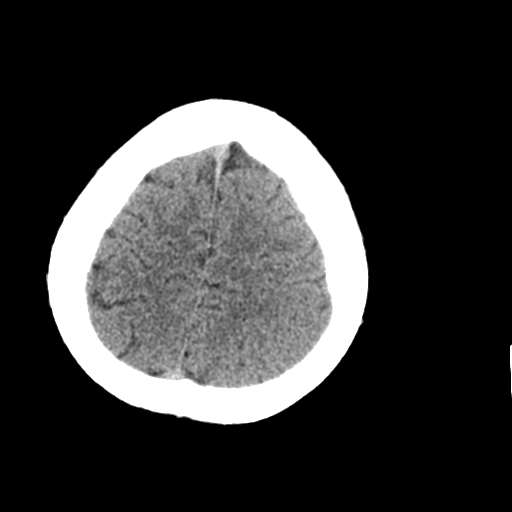
[im 31/34  brain]
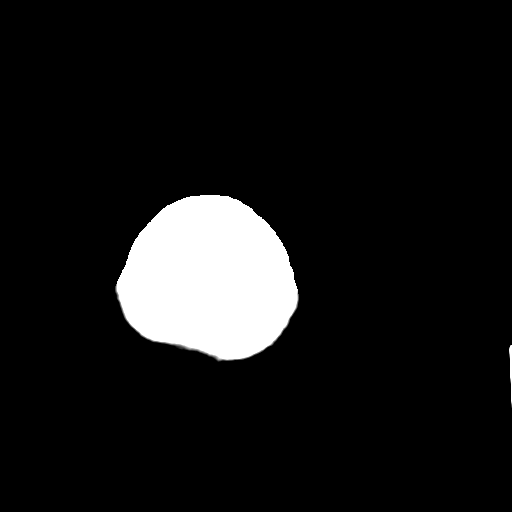
[im 31/34  bone]
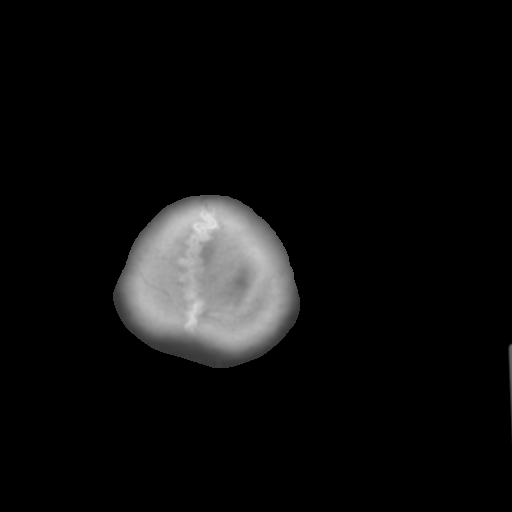

[Series 4: coronal soft · coronal · 0.33mm/px · 3 of 84 slices shown]
[im 28/84  brain]
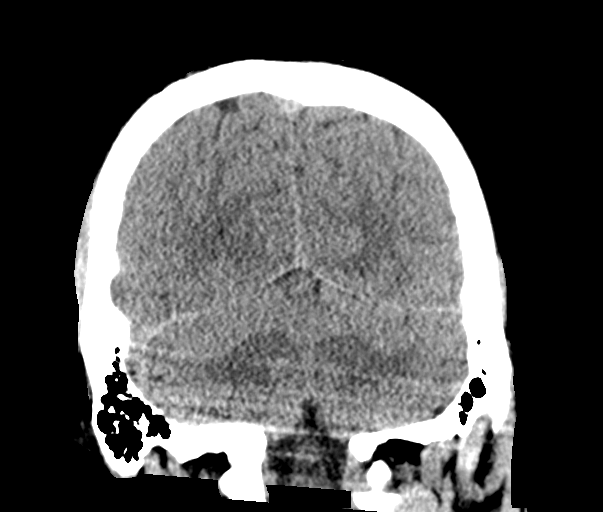
[im 37/84  brain]
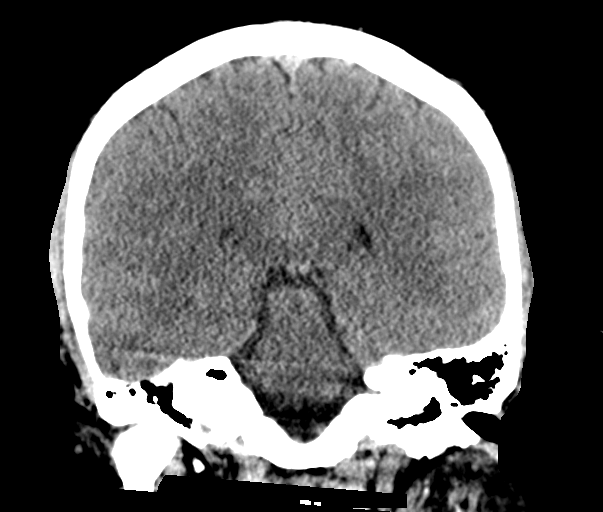
[im 47/84  brain]
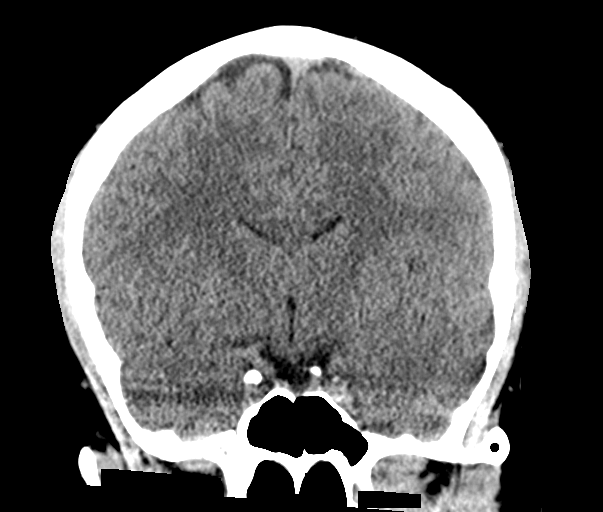

[Series 5: sagittal soft · sagittal · 0.35mm/px · 3 of 64 slices shown]
[im 22/64  brain]
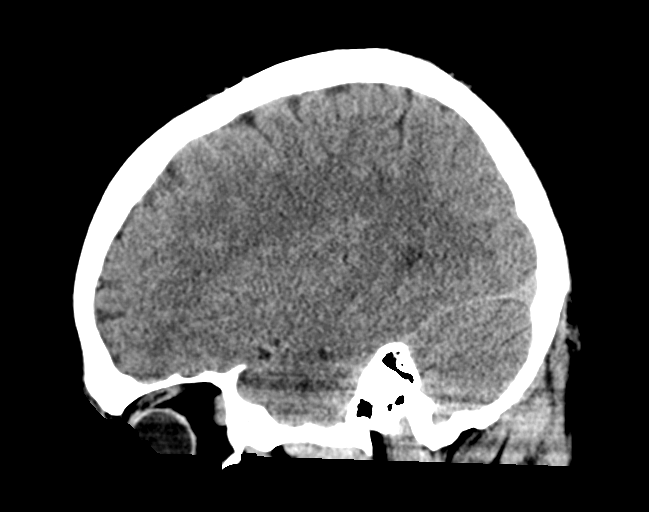
[im 32/64  brain]
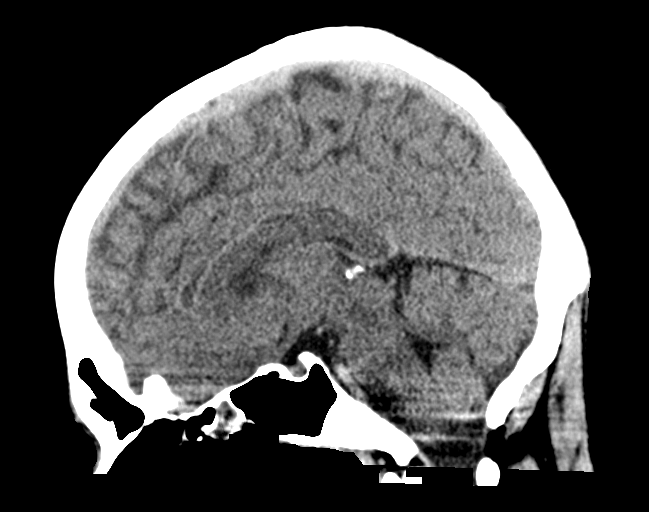
[im 43/64  brain]
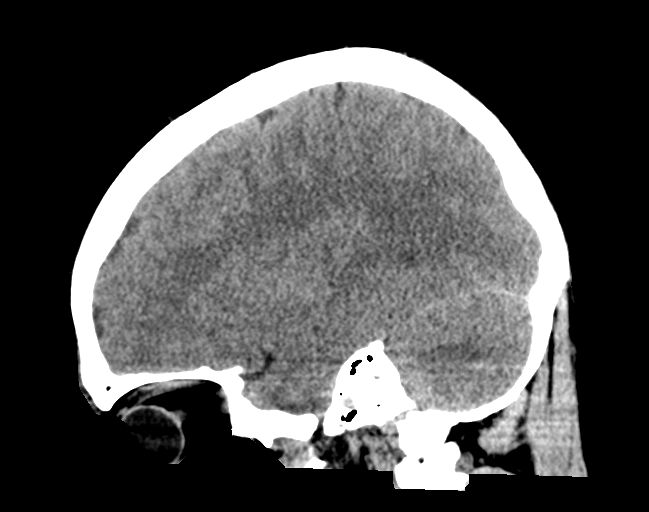

[15 of 47 positions shown; findings below may reference images not displayed]

FINDINGS: Brain: No acute territorial infarction, hemorrhage or intracranial
mass. The ventricles are nonenlarged

Vascular: No hyperdense vessel or unexpected calcification.

Skull: Normal. Negative for fracture or focal lesion.

Sinuses/Orbits: Mucosal thickening in the right ethmoid sinus

Other: None
IMPRESSION: Negative non contrasted CT appearance of the brain

## 2022-01-24 ENCOUNTER — Encounter (HOSPITAL_COMMUNITY): Payer: Self-pay

## 2022-01-24 ENCOUNTER — Other Ambulatory Visit: Payer: Self-pay

## 2022-01-24 ENCOUNTER — Emergency Department (HOSPITAL_COMMUNITY)
Admission: EM | Admit: 2022-01-24 | Discharge: 2022-01-24 | Disposition: A | Discharge status: Home / Self Care | Payer: Self-pay | Attending: Emergency Medicine | Admitting: Emergency Medicine

## 2022-01-24 DIAGNOSIS — M542 Cervicalgia: Secondary | ICD-10-CM | POA: Insufficient documentation

## 2022-01-24 MED ORDER — PREDNISONE 20 MG PO TABS: 40.0000 mg | ORAL_TABLET | Freq: Once | ORAL | Status: DC

## 2022-01-24 MED ORDER — LIDOCAINE 5 % EX PTCH
1.0000 | MEDICATED_PATCH | CUTANEOUS | Status: DC
  Administered 2022-01-24: 1 via TRANSDERMAL
  Filled 2022-01-24: qty 1

## 2022-01-24 MED ORDER — IBUPROFEN 600 MG PO TABS: 600.0000 mg | ORAL_TABLET | Freq: Four times a day (QID) | ORAL | 0 refills | Status: DC | PRN

## 2022-01-24 MED ORDER — LIDOCAINE 5 % EX PTCH: 1.0000 | MEDICATED_PATCH | CUTANEOUS | 0 refills | Status: DC

## 2022-01-24 MED ORDER — KETOROLAC TROMETHAMINE 30 MG/ML IJ SOLN
15.0000 mg | Freq: Once | INTRAMUSCULAR | Status: AC
  Administered 2022-01-24: 15 mg via INTRAMUSCULAR
  Filled 2022-01-24: qty 1

## 2022-01-24 NOTE — Discharge Instructions (Addendum)
Noted that your visit to the emergency department today was overall consistent with neck strain with cervical radiculopathy.  We will treat this with nonsteroidals as well as Lidoderm patch outpatient.  I recommend calling the number attached to your discharge papers for primary care follow-up to make sure symptoms are improving and not getting worse.  I have sent both of the medicines into the pharmacy of your preference.  Please do not hesitate to return to the emergency department for worrisome signs and symptoms we discussed become apparent.

## 2022-01-24 NOTE — ED Provider Notes (Addendum)
[facility] EMERGENCY DEPARTMENT Provider Note   CSN: [id number] Arrival date & time: 01/24/22  1725     History  Chief Complaint  Patient presents with   Neck Pain    Marc Porter is a 26 y.o. male.   Neck Pain   26 year old male presents emergency department with complaints of neck pain.  Patient states that symptoms began after awakening yesterday morning.  Patient is companied by wife who states that the patient had been sleeping in weird positions causing strain on his neck when she woke up before him that morning.  He notes pain shooting down his left arm.  Denies weakness or sensory deficits in affected arm.  Denies fever, chills, night sweats, cough, congestion, sore throat, fever, chills, night sweats.  Pain is worsened with movement and is relieved with rest.  He has tried no medications for this.  Denies any known traumatic mechanism.  Past medical history significant for cigarette use.  Home Medications Prior to Admission medications   Medication Sig Start Date End Date Taking? Authorizing Provider  ibuprofen (ADVIL) 600 MG tablet Take 1 tablet (600 mg total) by mouth every 6 (six) hours as needed. 01/24/22  Yes Dion Saucier A, PA  lidocaine (LIDODERM) 5 % Place 1 patch onto the skin daily. Remove & Discard patch within 12 hours or as directed by MD 01/24/22  Yes Dion Saucier A, PA  cyclobenzaprine (FLEXERIL) 10 MG tablet Take 1 tablet (10 mg total) by mouth 2 (two) times daily as needed for muscle spasms. 06/11/18   Evalee Jefferson, PA-C  ondansetron (ZOFRAN-ODT) 4 MG disintegrating tablet Take 1 tablet (4 mg total) by mouth every 8 (eight) hours as needed for nausea or vomiting. 10/21/21   Davonna Belling, MD  traMADol (ULTRAM) 50 MG tablet Take 1 tablet (50 mg total) by mouth every 6 (six) hours as needed. 06/11/18   Evalee Jefferson, PA-C      Allergies    Patient has no known allergies.    Review of Systems   Review of Systems  Musculoskeletal:  Positive for neck  pain.  All other systems reviewed and are negative.   Physical Exam Updated Vital Signs BP 119/79 (BP Location: Right Arm)   Pulse 72   Temp 98.2 F (36.8 C) (Oral)   Resp 20   Ht 6' (1.829 m)   Wt 81.6 kg   SpO2 97%   BMI 24.41 kg/m  Physical Exam Vitals and nursing note reviewed.  Constitutional:      General: He is not in acute distress.    Appearance: He is well-developed.  HENT:     Head: Normocephalic and atraumatic.  Eyes:     Conjunctiva/sclera: Conjunctivae normal.  Neck:     Comments: Kernig and Brudzinski is negative. Cardiovascular:     Rate and Rhythm: Normal rate and regular rhythm.     Heart sounds: No murmur heard. Pulmonary:     Effort: Pulmonary effort is normal. No respiratory distress.     Breath sounds: Normal breath sounds.  Abdominal:     Palpations: Abdomen is soft.     Tenderness: There is no abdominal tenderness.  Musculoskeletal:        General: No swelling.     Cervical back: Normal range of motion and neck supple. No rigidity or tenderness.     Comments: No obvious swelling or overlying skin abnormalities noted on patient's upper extremities.  Patient is full active range of motion of left upper extremities at  shoulder, elbow and wrist.  Muscle strength 5 out of 5 for upper extremities.  Radial pulses full and intact bilaterally.  Patient complaining no sensory deficits on major nerve distributions of upper extremities.  No midline tenderness of cervical, thoracic, lumbar spine with no obvious step-off or deformity.  Patient has tenderness to palpation of left paraspinal cervical muscles with extension into upper trapezial left ridge.  Pain is worsened with horizontal rotation to the left of neck as well as neck extension.  Skin:    General: Skin is warm and dry.     Capillary Refill: Capillary refill takes less than 2 seconds.  Neurological:     Mental Status: He is alert.  Psychiatric:        Mood and Affect: Mood normal.     ED Results  / Procedures / Treatments   Labs (all labs ordered are listed, but only abnormal results are displayed) Labs Reviewed - No data to display  EKG None  Radiology No results found.  Procedures Procedures    Medications Ordered in ED Medications  lidocaine (LIDODERM) 5 % 1 patch (1 patch Transdermal Patch Applied 01/24/22 2113)  ketorolac (TORADOL) 30 MG/ML injection 15 mg (15 mg Intramuscular Given 01/24/22 2112)    ED Course/ Medical Decision Making/ A&P                           Medical Decision Making Risk Prescription drug management.   This patient presents to the ED for concern of neck pain, this involves an extensive number of treatment options, and is a complaint that carries with it a high risk of complications and morbidity.  The differential diagnosis includes strain/sprain, fracture, dislocation, meningitis   Co morbidities that complicate the patient evaluation  See HPI   Additional history obtained:  Additional history obtained from EMR  Lab Tests:  N/a  Imaging Studies ordered:  N/a   Cardiac Monitoring: / EKG:  The patient was maintained on a cardiac monitor.  I personally viewed and interpreted the cardiac monitored which showed an underlying rhythm of: Sinus rhythm   Consultations Obtained:  N/a neck pain   Problem List / ED Course / Critical interventions / Medication management  Neck pain I ordered medication including Toradol and Lidoderm for pain   Reevaluation of the patient after these medicines showed that the patient improved I have reviewed the patients home medicines and have made adjustments as needed   Social Determinants of Health:  Chronic cigarette use.  Denies illicit drug use.   Test / Admission - Considered:  Neck pain Vitals signs within normal range and stable throughout visit. Laboratory imaging studies considered but deemed unnecessary.  Patient's symptoms likely secondary to cervical radiculopathy due to  inflamed musculature surrounding left cervical spine.  Doubt overt fracture or dislocation given lack of traumatic mechanism.  Doubt meningitis given lack of associated symptoms, afebrile state as well as supple neck on exam.  Patient has intact sensation as well as muscle strength bilaterally upper extremities.  We will treat with nonsteroidals and Lidoderm patch with close follow-up with PCP.  Treatment plan was discussed at length with the patient and he knowledge understanding was agreeable to said plan. Worrisome signs and symptoms were discussed with the patient, and the patient acknowledged understanding to return to the ED if noticed. Patient was stable upon discharge.         Final Clinical Impression(s) / ED Diagnoses Final diagnoses:  Neck pain    Rx / DC Orders ED Discharge Orders          Ordered    ibuprofen (ADVIL) 600 MG tablet  Every 6 hours PRN        01/24/22 2113    lidocaine (LIDODERM) 5 %  Every 24 hours        01/24/22 2113              Peter Garter, [place] 01/24/22 2114    Peter Garter, [place] 01/24/22 2115    Glyn Ade, MD 01/24/22 2317

## 2022-01-24 NOTE — ED Triage Notes (Signed)
Pt to er, pt c/o neck pain, pt states that he can touch his chin to his chest, but trying to lift his head the pain increased,  denies fever or cough, denies trauma or injury.  Pt states that his neck has hurt for the past two days

## 2022-02-17 ENCOUNTER — Ambulatory Visit: Payer: Self-pay | Admitting: *Deleted

## 2022-02-17 NOTE — Telephone Encounter (Signed)
Patient's wife called to report sx.  Chief Complaint: V/D Symptoms: watery diarrhea green in color, pale , weakness , tolerating small amount of water , gatorade. Chills. Ate at [employer] and sx started. Frequency: 2 days ago  Pertinent Negatives: Patient denies na  Disposition: [x] ED /[] Urgent Care (no appt availability in office) / [] Appointment(In office/virtual)/ []  [place] [facility]/ [] Home Care/ [] Refused Recommended Disposition /[] [place] [facility]/ []  Follow-up with PCP Additional Notes:   No PCP. Instructed patient to go to ED now.     Reason for Disposition  Patient sounds very sick or weak to the triager  Answer Assessment - Initial Assessment Questions 1. DIARRHEA SEVERITY: "How bad is the diarrhea?" "How many more stools have you had in the past 24 hours than normal?"    - NO DIARRHEA (SCALE 0)   - MILD (SCALE 1-3): Few loose or mushy BMs; increase of 1-3 stools over normal daily number of stools; mild increase in ostomy output.   -  MODERATE (SCALE 4-7): Increase of 4-6 stools daily over normal; moderate increase in ostomy output.   -  SEVERE (SCALE 8-10; OR "WORST POSSIBLE"): Increase of 7 or more stools daily over normal; moderate increase in ostomy output; incontinence.     Severe like water 2. ONSET: "When did the diarrhea begin?"      2 days ago  3. BM CONSISTENCY: "How loose or watery is the diarrhea?"      Watery  4. VOMITING: "Are you also vomiting?" If Yes, ask: "How many times in the past 24 hours?"      Feels  5. ABDOMEN PAIN: "Are you having any abdomen pain?" If Yes, ask: "What does it feel like?" (e.g., crampy, dull, intermittent, constant)      crampy 6. ABDOMEN PAIN SEVERITY: If present, ask: "How bad is the pain?"  (e.g., Scale 1-10; mild, moderate, or severe)   - MILD (1-3): doesn't interfere with normal activities, abdomen soft and not tender to touch    - MODERATE (4-7): interferes with normal activities or awakens from sleep, abdomen  tender to touch    - SEVERE (8-10): excruciating pain, doubled over, unable to do any normal activities       Pale , chills, incontinence at times  7. ORAL INTAKE: If vomiting, "Have you been able to drink liquids?" "How much liquids have you had in the past 24 hours?"     Some water and gatorade  8. HYDRATION: "Any signs of dehydration?" (e.g., dry mouth [not just dry lips], too weak to stand, dizziness, new weight loss) "When did you last urinate?"     Pale in color weakness, chills  9. EXPOSURE: "Have you traveled to a foreign country recently?" "Have you been exposed to anyone with diarrhea?" "Could you have eaten any food that was spoiled?"     Ate at [facility] 10. ANTIBIOTIC USE: "Are you taking antibiotics now or have you taken antibiotics in the past 2 months?"       na 11. OTHER SYMPTOMS: "Do you have any other symptoms?" (e.g., fever, blood in stool)       Pale , chills diarrhea green  12. PREGNANCY: "Is there any chance you are pregnant?" "When was your last menstrual period?"       na  Protocols used: [facility]

## 2022-09-20 DIAGNOSIS — Z419 Encounter for procedure for purposes other than remedying health state, unspecified: Secondary | ICD-10-CM | POA: Diagnosis not present

## 2022-10-21 DIAGNOSIS — Z419 Encounter for procedure for purposes other than remedying health state, unspecified: Secondary | ICD-10-CM | POA: Diagnosis not present

## 2022-10-22 DIAGNOSIS — X500XXA Overexertion from strenuous movement or load, initial encounter: Secondary | ICD-10-CM | POA: Diagnosis not present

## 2022-10-22 DIAGNOSIS — R079 Chest pain, unspecified: Secondary | ICD-10-CM | POA: Diagnosis not present

## 2022-10-22 DIAGNOSIS — F17220 Nicotine dependence, chewing tobacco, uncomplicated: Secondary | ICD-10-CM | POA: Diagnosis not present

## 2022-10-22 DIAGNOSIS — S39012A Strain of muscle, fascia and tendon of lower back, initial encounter: Secondary | ICD-10-CM | POA: Diagnosis not present

## 2022-11-20 DIAGNOSIS — Z419 Encounter for procedure for purposes other than remedying health state, unspecified: Secondary | ICD-10-CM | POA: Diagnosis not present

## 2022-11-29 ENCOUNTER — Encounter (HOSPITAL_COMMUNITY): Payer: Self-pay

## 2022-11-29 ENCOUNTER — Emergency Department (HOSPITAL_COMMUNITY)
Admission: EM | Admit: 2022-11-29 | Discharge: 2022-11-30 | Disposition: A | Discharge status: Home / Self Care | Payer: Medicaid Other | Attending: Emergency Medicine | Admitting: Emergency Medicine

## 2022-11-29 ENCOUNTER — Other Ambulatory Visit: Payer: Self-pay

## 2022-11-29 DIAGNOSIS — R4589 Other symptoms and signs involving emotional state: Secondary | ICD-10-CM | POA: Diagnosis not present

## 2022-11-29 DIAGNOSIS — R000 Tachycardia, unspecified: Secondary | ICD-10-CM | POA: Insufficient documentation

## 2022-11-29 DIAGNOSIS — F17210 Nicotine dependence, cigarettes, uncomplicated: Secondary | ICD-10-CM | POA: Diagnosis not present

## 2022-11-29 DIAGNOSIS — T50902A Poisoning by unspecified drugs, medicaments and biological substances, intentional self-harm, initial encounter: Secondary | ICD-10-CM | POA: Diagnosis not present

## 2022-11-29 DIAGNOSIS — R6889 Other general symptoms and signs: Secondary | ICD-10-CM | POA: Diagnosis not present

## 2022-11-29 DIAGNOSIS — F410 Panic disorder [episodic paroxysmal anxiety] without agoraphobia: Secondary | ICD-10-CM | POA: Diagnosis not present

## 2022-11-29 DIAGNOSIS — Z743 Need for continuous supervision: Secondary | ICD-10-CM | POA: Diagnosis not present

## 2022-11-29 DIAGNOSIS — R079 Chest pain, unspecified: Secondary | ICD-10-CM | POA: Diagnosis not present

## 2022-11-29 DIAGNOSIS — R0789 Other chest pain: Secondary | ICD-10-CM | POA: Diagnosis present

## 2022-11-29 NOTE — ED Triage Notes (Signed)
BIBA from home c/o dizziness and tingling to hands after intake of THC 50mg  gummy @ 2145.  A&O x4 on arrival.

## 2022-11-30 ENCOUNTER — Emergency Department (HOSPITAL_COMMUNITY): Payer: Medicaid Other

## 2022-11-30 DIAGNOSIS — R079 Chest pain, unspecified: Secondary | ICD-10-CM | POA: Diagnosis not present

## 2022-11-30 LAB — BASIC METABOLIC PANEL
Anion gap: 12 (ref 5–15)
BUN: 15 mg/dL (ref 6–20)
CO2: 21 mmol/L — ABNORMAL LOW (ref 22–32)
Calcium: 9.7 mg/dL (ref 8.9–10.3)
Chloride: 101 mmol/L (ref 98–111)
Creatinine, Ser: 1.18 mg/dL (ref 0.61–1.24)
GFR, Estimated: >60 mL/min (ref >60)
Glucose, Bld: 213 mg/dL — ABNORMAL HIGH (ref 70–99)
Potassium: 3.6 mmol/L (ref 3.5–5.1)
Sodium: 134 mmol/L — ABNORMAL LOW (ref 135–145)

## 2022-11-30 LAB — TROPONIN I (HIGH SENSITIVITY): Troponin I (High Sensitivity): 3 ng/L (ref <18)

## 2022-11-30 LAB — CBC
HCT: 38.8 % — ABNORMAL LOW (ref 39.0–52.0)
Hemoglobin: 13.7 g/dL (ref 13.0–17.0)
MCH: 31.2 pg (ref 26.0–34.0)
MCHC: 35.3 g/dL (ref 30.0–36.0)
MCV: 88.4 fL (ref 80.0–100.0)
Platelets: 205 10*3/uL (ref 150–400)
RBC: 4.39 MIL/uL (ref 4.22–5.81)
RDW: 12.5 % (ref 11.5–15.5)
WBC: 8.7 10*3/uL (ref 4.0–10.5)
nRBC: 0 % (ref 0.0–0.2)

## 2022-11-30 MED ORDER — SODIUM CHLORIDE 0.9 % IV BOLUS
1000.0000 mL | Freq: Once | INTRAVENOUS | Status: AC
  Administered 2022-11-30: 1000 mL via INTRAVENOUS

## 2022-11-30 MED ORDER — LORAZEPAM 1 MG PO TABS
1.0000 mg | ORAL_TABLET | Freq: Once | ORAL | Status: AC | PRN
  Administered 2022-11-30: 1 mg via ORAL
  Filled 2022-11-30: qty 1

## 2022-11-30 NOTE — Discharge Instructions (Signed)
You were evaluated in the Emergency Department and after careful evaluation, we did not find any emergent condition requiring admission or further testing in the hospital.  Your exam/testing today is overall reassuring.  Please return to the Emergency Department if you experience any worsening of your condition.   Thank you for allowing us to be a part of your care. 

## 2022-11-30 NOTE — ED Provider Notes (Signed)
[facility]-EMERGENCY DEPT [facility] Emergency Department Provider Note MRN:  [id number]  Arrival date & time: 11/30/22     Chief Complaint   Anxiety   History of Present Illness   Marc Porter is a 27 y.o. year-old male with no pertinent past medical history presenting to the ED with chief complaint of anxiety.  Patient feeling extremely anxious and panicked.  Took some CBD Gummies this evening and shortly after began feeling the symptoms.  Feels like his heart is racing.  Having some chest discomfort.  Review of Systems  A thorough review of systems was obtained and all systems are negative except as noted in the HPI and PMH.   Patient's Health History   History reviewed. No pertinent past medical history.  Past Surgical History:  Procedure Laterality Date   TONSILLECTOMY     TONSILLECTOMY AND ADENOIDECTOMY      History reviewed. No pertinent family history.  Social History   Socioeconomic History   Marital status: Single    Spouse name: Not on file   Number of children: Not on file   Years of education: Not on file   Highest education level: Not on file  Occupational History   Not on file  Tobacco Use   Smoking status: Every Day    Current packs/day: 0.50    Types: Cigarettes   Smokeless tobacco: Former    Types: Engineer, drilling   Vaping status: Never Used  Substance and Sexual Activity   Alcohol use: Yes    Comment: occasionally   Drug use: Yes    Types: Marijuana   Sexual activity: Not on file  Other Topics Concern   Not on file  Social History Narrative   Not on file   Social Determinants of Health   Financial Resource Strain: Not on file  Food Insecurity: Not on file  Transportation Needs: Not on file  Physical Activity: Not on file  Stress: Not on file  Social Connections: Not on file  Intimate Partner Violence: Not on file     Physical Exam   Vitals:   11/30/22 0030 11/30/22 0045  BP: 124/66 120/79  Pulse: (!) 103 98  Resp: 11 13   Temp:    SpO2: 98% 97%    CONSTITUTIONAL: Well-appearing, NAD NEURO/PSYCH:  Alert and oriented x 3, no focal deficits EYES:  eyes equal and reactive ENT/NECK:  no LAD, no JVD CARDIO: Tachycardic rate, well-perfused, normal S1 and S2 PULM:  CTAB no wheezing or rhonchi GI/GU:  non-distended, non-tender MSK/SPINE:  No gross deformities, no edema SKIN:  no rash, atraumatic   *Additional and/or pertinent findings included in MDM below  Diagnostic and Interventional Summary    EKG Interpretation Date/Time:  Thursday November 30 2022 00:23:41 EDT Ventricular Rate:  121 PR Interval:  128 QRS Duration:  87 QT Interval:  284 QTC Calculation: 403 R Axis:   73  Text Interpretation: Sinus tachycardia Repol abnrm suggests ischemia, diffuse leads Confirmed by Kennis Carina [phone]) on 11/30/2022 12:54:44 AM       Labs Reviewed  CBC - Abnormal; Notable for the following components:      Result Value   HCT 38.8 (*)    All other components within normal limits  BASIC METABOLIC PANEL - Abnormal; Notable for the following components:   Sodium 134 (*)    CO2 21 (*)    Glucose, Bld 213 (*)    All other components within normal limits  TROPONIN I (HIGH SENSITIVITY)  TROPONIN I (HIGH SENSITIVITY)    DG Chest Port 1 View  Final Result      Medications  LORazepam (ATIVAN) tablet 1 mg (1 mg Oral Given 11/30/22 0018)  sodium chloride 0.9 % bolus 1,000 mL (1,000 mLs Intravenous New Bag/Given 11/30/22 0059)     Procedures  /  Critical Care Procedures  ED Course and Medical Decision Making  Initial Impression and Ddx Patient is hyperventilating, seems very anxious.  Suspect all related to CBD.  Obtaining screening EKG and chest x-ray given the chest pain.  Past medical/surgical history that increases complexity of ED encounter: None  Interpretation of Diagnostics I personally reviewed the EKG and my interpretation is as follows: Sinus rhythm, tachycardic, nonspecific findings that could  suggest ischemia in the right patient.  EKG triggered laboratory assessment with troponin and there are no significant blood counts or electrolyte disturbances, troponin negative.  Chest x-ray normal.  Patient Reassessment and Ultimate Disposition/Management     Patient's heart rate and symptoms resolved after Ativan, appropriate for discharge.  Patient management required discussion with the following services or consulting groups:  None  Complexity of Problems Addressed Acute illness or injury that poses threat of life of bodily function  Additional Data Reviewed and Analyzed Further history obtained from: Further history from spouse/family member  Additional Factors Impacting ED Encounter Risk None  Marc Porter. Marc Plate, MD [facility] Emergency Medicine [facility] [email or web address]  Final Clinical Impressions(s) / ED Diagnoses     ICD-10-CM   1. Panic attack  F41.0     2. Purposeful non-suicidal drug ingestion, initial encounter [facility])  T50.902A       ED Discharge Orders     None        Discharge Instructions Discussed with and Provided to Patient:    Discharge Instructions      You were evaluated in the Emergency Department and after careful evaluation, we did not find any emergent condition requiring admission or further testing in the hospital.  Your exam/testing today is overall reassuring.  Please return to the Emergency Department if you experience any worsening of your condition.   Thank you for allowing [place] to be a part of your care.      Sabas Sous, MD 11/30/22 [phone]

## 2023-01-23 ENCOUNTER — Encounter (HOSPITAL_COMMUNITY): Payer: Self-pay

## 2023-01-23 ENCOUNTER — Other Ambulatory Visit: Payer: Self-pay

## 2023-01-23 ENCOUNTER — Emergency Department (HOSPITAL_COMMUNITY)
Admission: EM | Admit: 2023-01-23 | Discharge: 2023-01-24 | Disposition: A | Discharge status: Home / Self Care | Payer: MEDICAID | Attending: Emergency Medicine | Admitting: Emergency Medicine

## 2023-01-23 ENCOUNTER — Emergency Department (HOSPITAL_COMMUNITY): Payer: MEDICAID

## 2023-01-23 DIAGNOSIS — S0990XA Unspecified injury of head, initial encounter: Secondary | ICD-10-CM | POA: Diagnosis present

## 2023-01-23 DIAGNOSIS — Y92410 Unspecified street and highway as the place of occurrence of the external cause: Secondary | ICD-10-CM | POA: Insufficient documentation

## 2023-01-23 DIAGNOSIS — M542 Cervicalgia: Secondary | ICD-10-CM | POA: Diagnosis not present

## 2023-01-23 DIAGNOSIS — S060X1A Concussion with loss of consciousness of 30 minutes or less, initial encounter: Secondary | ICD-10-CM | POA: Insufficient documentation

## 2023-01-23 DIAGNOSIS — F419 Anxiety disorder, unspecified: Secondary | ICD-10-CM | POA: Insufficient documentation

## 2023-01-23 DIAGNOSIS — M546 Pain in thoracic spine: Secondary | ICD-10-CM | POA: Insufficient documentation

## 2023-01-23 LAB — CBC WITH DIFFERENTIAL/PLATELET
Abs Immature Granulocytes: 0.01 10*3/uL (ref 0.00–0.07)
Basophils Absolute: 0 10*3/uL (ref 0.0–0.1)
Basophils Relative: 0 %
Eosinophils Absolute: 0 10*3/uL (ref 0.0–0.5)
Eosinophils Relative: 0 %
HCT: 44.2 % (ref 39.0–52.0)
Hemoglobin: 15.8 g/dL (ref 13.0–17.0)
Immature Granulocytes: 0 %
Lymphocytes Relative: 20 %
Lymphs Abs: 1.4 10*3/uL (ref 0.7–4.0)
MCH: 30.9 pg (ref 26.0–34.0)
MCHC: 35.7 g/dL (ref 30.0–36.0)
MCV: 86.5 fL (ref 80.0–100.0)
Monocytes Absolute: 0.5 10*3/uL (ref 0.1–1.0)
Monocytes Relative: 7 %
Neutro Abs: 5 10*3/uL (ref 1.7–7.7)
Neutrophils Relative %: 73 %
Platelets: 256 10*3/uL (ref 150–400)
RBC: 5.11 MIL/uL (ref 4.22–5.81)
RDW: 12.1 % (ref 11.5–15.5)
WBC: 7 10*3/uL (ref 4.0–10.5)
nRBC: 0 % (ref 0.0–0.2)

## 2023-01-23 MED ORDER — KETOROLAC TROMETHAMINE 15 MG/ML IJ SOLN
15.0000 mg | Freq: Once | INTRAMUSCULAR | Status: AC
  Administered 2023-01-23: 15 mg via INTRAVENOUS
  Filled 2023-01-23: qty 1

## 2023-01-23 MED ORDER — LACTATED RINGERS IV BOLUS
1000.0000 mL | Freq: Once | INTRAVENOUS | Status: AC
  Administered 2023-01-23: 1000 mL via INTRAVENOUS

## 2023-01-23 MED ORDER — METHOCARBAMOL 500 MG PO TABS
750.0000 mg | ORAL_TABLET | Freq: Once | ORAL | Status: AC
  Administered 2023-01-23: 750 mg via ORAL
  Filled 2023-01-23: qty 2
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
## 2023-01-24 LAB — COMPREHENSIVE METABOLIC PANEL
ALT: 27 U/L (ref 0–44)
AST: 19 U/L (ref 15–41)
Albumin: 4.5 g/dL (ref 3.5–5.0)
Alkaline Phosphatase: 83 U/L (ref 38–126)
Anion gap: 8 (ref 5–15)
BUN: 14 mg/dL (ref 6–20)
CO2: 27 mmol/L (ref 22–32)
Calcium: 9.7 mg/dL (ref 8.9–10.3)
Chloride: 102 mmol/L (ref 98–111)
Creatinine, Ser: 1.01 mg/dL (ref 0.61–1.24)
GFR, Estimated: >60 mL/min (ref >60)
Glucose, Bld: 93 mg/dL (ref 70–99)
Potassium: 4.1 mmol/L (ref 3.5–5.1)
Sodium: 137 mmol/L (ref 135–145)
Total Bilirubin: 0.6 mg/dL (ref 0.3–1.2)
Total Protein: 7.3 g/dL (ref 6.5–8.1)

## 2023-01-24 LAB — MAGNESIUM: Magnesium: 2 mg/dL (ref 1.7–2.4)

## 2023-01-24 NOTE — Discharge Instructions (Signed)
Take ibuprofen as needed for headaches and muscle soreness.  Avoid activities that worsen your symptoms.  Return to the emergency department for any new or worsening symptoms of concern.

## 2023-06-21 IMAGING — DX DG TOE GREAT 2+V*R*
3 series · 3 of 3 positions shown · non-contrast
Comparison: None.

CLINICAL DATA: Posttraumatic pain and swelling to the great toe.

EXAM:
RIGHT GREAT TOE

[toe ap]
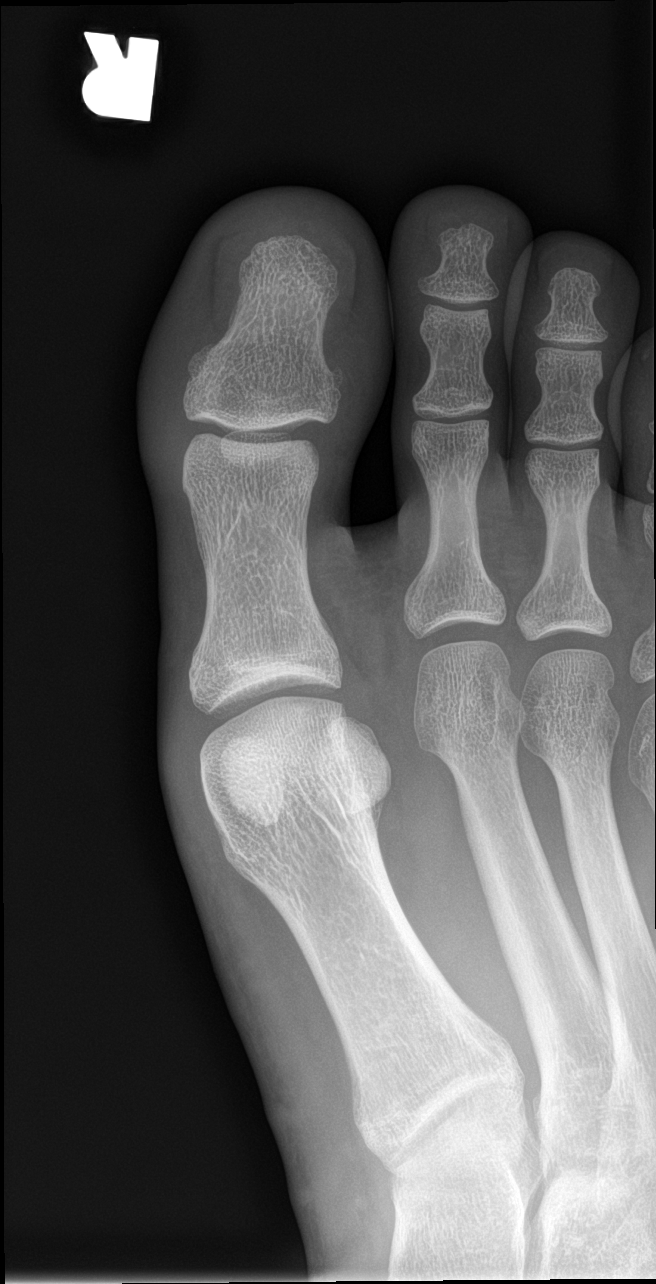

[toe obl]
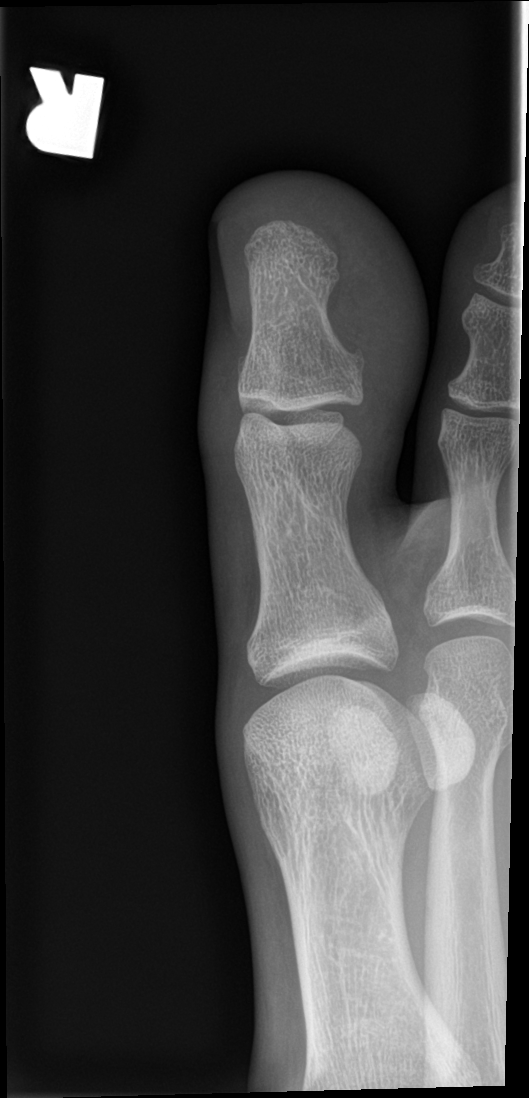

[toe lat]
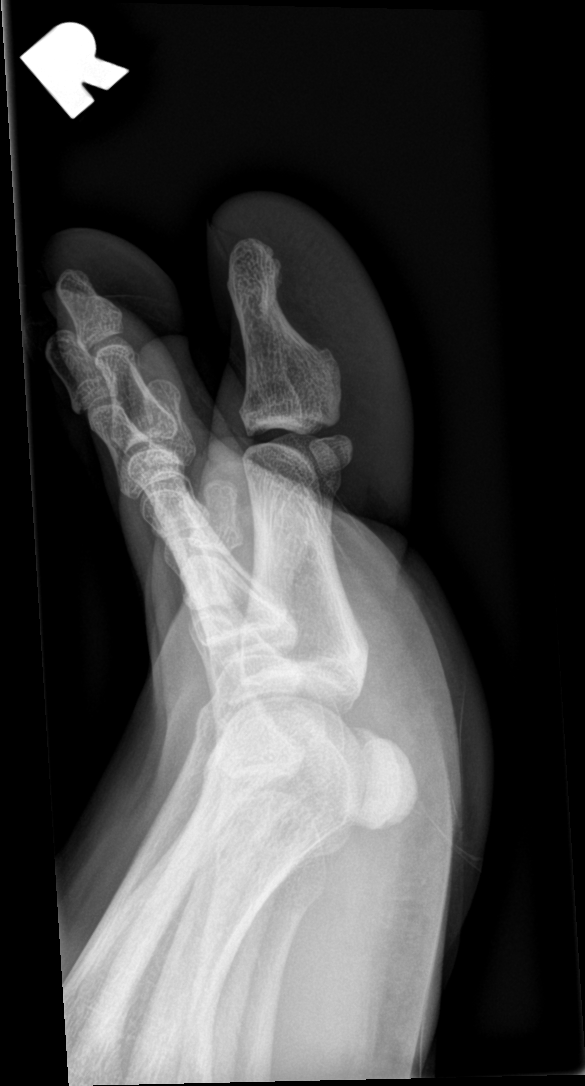

[3 of 3 positions shown; findings below may reference images not displayed]

FINDINGS: There is no evidence of fracture or dislocation. There is no
evidence of arthropathy or other focal bone abnormality. Soft
tissues are unremarkable.
IMPRESSION: Negative.

## 2023-08-13 ENCOUNTER — Encounter (HOSPITAL_COMMUNITY): Payer: Self-pay | Admitting: Emergency Medicine

## 2023-08-13 ENCOUNTER — Other Ambulatory Visit: Payer: Self-pay

## 2023-08-13 ENCOUNTER — Emergency Department (HOSPITAL_COMMUNITY)
Admission: EM | Admit: 2023-08-13 | Discharge: 2023-08-13 | Disposition: A | Discharge status: Home / Self Care | Payer: MEDICAID | Attending: Emergency Medicine | Admitting: Emergency Medicine

## 2023-08-13 ENCOUNTER — Emergency Department (HOSPITAL_COMMUNITY): Payer: MEDICAID

## 2023-08-13 DIAGNOSIS — R079 Chest pain, unspecified: Secondary | ICD-10-CM | POA: Diagnosis not present

## 2023-08-13 DIAGNOSIS — R000 Tachycardia, unspecified: Secondary | ICD-10-CM | POA: Diagnosis present

## 2023-08-13 DIAGNOSIS — Z87891 Personal history of nicotine dependence: Secondary | ICD-10-CM | POA: Diagnosis not present

## 2023-08-13 DIAGNOSIS — R002 Palpitations: Secondary | ICD-10-CM

## 2023-08-13 DIAGNOSIS — E876 Hypokalemia: Secondary | ICD-10-CM | POA: Diagnosis not present

## 2023-08-13 LAB — CBC
HCT: 40.8 % (ref 39.0–52.0)
Hemoglobin: 14.2 g/dL (ref 13.0–17.0)
MCH: 30.7 pg (ref 26.0–34.0)
MCHC: 34.8 g/dL (ref 30.0–36.0)
MCV: 88.3 fL (ref 80.0–100.0)
Platelets: 211 10*3/uL (ref 150–400)
RBC: 4.62 MIL/uL (ref 4.22–5.81)
RDW: 12.2 % (ref 11.5–15.5)
WBC: 6 10*3/uL (ref 4.0–10.5)
nRBC: 0 % (ref 0.0–0.2)

## 2023-08-13 LAB — COMPREHENSIVE METABOLIC PANEL
ALT: 17 U/L (ref 0–44)
AST: 15 U/L (ref 15–41)
Albumin: 4.5 g/dL (ref 3.5–5.0)
Alkaline Phosphatase: 77 U/L (ref 38–126)
Anion gap: 9 (ref 5–15)
BUN: 11 mg/dL (ref 6–20)
CO2: 23 mmol/L (ref 22–32)
Calcium: 9.4 mg/dL (ref 8.9–10.3)
Chloride: 104 mmol/L (ref 98–111)
Creatinine, Ser: 0.9 mg/dL (ref 0.61–1.24)
GFR, Estimated: >60 mL/min (ref >60)
Glucose, Bld: 98 mg/dL (ref 70–99)
Potassium: 3.4 mmol/L — ABNORMAL LOW (ref 3.5–5.1)
Sodium: 136 mmol/L (ref 135–145)
Total Bilirubin: 0.5 mg/dL (ref 0.0–1.2)
Total Protein: 7.1 g/dL (ref 6.5–8.1)

## 2023-08-13 LAB — TROPONIN I (HIGH SENSITIVITY): Troponin I (High Sensitivity): <2 ng/L (ref <18)

## 2023-08-13 NOTE — ED Provider Notes (Signed)
  EMERGENCY DEPARTMENT AT [facility] ([facility]) Provider Note   CSN: [id number] Arrival date & time: 08/13/23  1629     History  Chief Complaint  Patient presents with   Tachycardia    Marc Porter is a 28 y.o. male with overall noncontributory past medical history, he endorses some marijuana, tobacco use, and drinks energy drinks who presents concern for elevated heart rate and chest pain associated with elevated heart rate.  He reports that this started early this morning, when he stood or walked around his heart rate would elevate.  He reports that he normally drinks energy medications, no recent changes to his routine.  He does report that he does not eat or drink very much during the day because he is working through shift.  He reports that his heart rate feels back to normal and he is chest pain-free at this time.  He denies any nausea, vomiting.  No family history of ACS less than 50.  No recent travel, no history of blood clots.  HPI     Home Medications Prior to Admission medications   Medication Sig Start Date End Date Taking? Authorizing Provider  cyclobenzaprine (FLEXERIL) 10 MG tablet Take 1 tablet (10 mg total) by mouth 2 (two) times daily as needed for muscle spasms. 06/11/18   Burgess Amor, PA-C  ibuprofen (ADVIL) 600 MG tablet Take 1 tablet (600 mg total) by mouth every 6 (six) hours as needed. 01/24/22   Sherian Maroon A, PA  lidocaine (LIDODERM) 5 % Place 1 patch onto the skin daily. Remove & Discard patch within 12 hours or as directed by MD 01/24/22   Sherian Maroon A, PA  ondansetron (ZOFRAN-ODT) 4 MG disintegrating tablet Take 1 tablet (4 mg total) by mouth every 8 (eight) hours as needed for nausea or vomiting. 10/21/21   Benjiman Core, MD  traMADol (ULTRAM) 50 MG tablet Take 1 tablet (50 mg total) by mouth every 6 (six) hours as needed. 06/11/18   Burgess Amor, PA-C      Allergies    Patient has no known allergies.    Review of Systems   Review of  Systems  All other systems reviewed and are negative.   Physical Exam Updated Vital Signs BP 137/87 (BP Location: Right Arm)   Pulse 70   Temp 97.7 F (36.5 C) (Oral)   Resp 18   Ht 6' (1.829 m)   Wt 79.4 kg   SpO2 99%   BMI 23.73 kg/m  Physical Exam Vitals and nursing note reviewed.  Constitutional:      General: He is not in acute distress.    Appearance: Normal appearance.  HENT:     Head: Normocephalic and atraumatic.  Eyes:     General:        Right eye: No discharge.        Left eye: No discharge.  Cardiovascular:     Rate and Rhythm: Normal rate and regular rhythm.     Heart sounds: No murmur heard.    No friction rub. No gallop.     Comments: No tenderness to palpation of the chest wall, normal heart rate and rhythm  Pulmonary:     Effort: Pulmonary effort is normal.     Breath sounds: Normal breath sounds.  Abdominal:     General: Bowel sounds are normal.     Palpations: Abdomen is soft.  Skin:    General: Skin is warm and dry.     Capillary Refill:  Capillary refill takes less than 2 seconds.  Neurological:     Mental Status: He is alert and oriented to person, place, and time.  Psychiatric:        Mood and Affect: Mood normal.        Behavior: Behavior normal.     ED Results / Procedures / Treatments   Labs (all labs ordered are listed, but only abnormal results are displayed) Labs Reviewed  COMPREHENSIVE METABOLIC PANEL - Abnormal; Notable for the following components:      Result Value   Potassium 3.4 (*)    All other components within normal limits  CBC  TROPONIN I (HIGH SENSITIVITY)    EKG EKG Interpretation Date/Time:  Monday August 13 2023 16:51:28 EDT Ventricular Rate:  96 PR Interval:  122 QRS Duration:  86 QT Interval:  320 QTC Calculation: 404 R Axis:   70  Text Interpretation: Normal sinus rhythm with sinus arrhythmia Confirmed by Gloris Manchester ([id number]) on 08/13/2023 6:27:30 PM  Radiology DG Chest Portable 1 View Result Date:  08/13/2023 CLINICAL DATA:  Palpitations and shortness of breath. EXAM: PORTABLE CHEST 1 VIEW COMPARISON:  Chest x-ray dated November 30, 2022. FINDINGS: The heart size and mediastinal contours are within normal limits. Both lungs are clear. The visualized skeletal structures are unremarkable. IMPRESSION: No active disease. Electronically Signed   By: Obie Dredge M.D.   On: 08/13/2023 19:45    Procedures Procedures    Medications Ordered in ED Medications - No data to display  ED Course/ Medical Decision Making/ A&P                                 Medical Decision Making Amount and/or Complexity of Data Reviewed Labs: ordered. Radiology: ordered.   This patient is a 28 y.o. male  who presents to the ED for concern of palpitations, chest pain.   Differential diagnoses prior to evaluation: The emergent differential diagnosis includes, but is not limited to,  unstable tachydysrhythmia, unstable bradycardia, sick sinus syndrome, heart block or other abnormal electrophysiologic abnormality including WPW, LGL, brugada, SVT, afib, a flutter, vs other . This is not an exhaustive differential.   Past Medical History / Co-morbidities / Social History: Tobacco abuse, marijuana use, energy drink use,  Physical Exam: Physical exam performed. The pertinent findings include: Stable vital signs, no distress, no tenderness palpation of the chest wall, borderline tachycardic on arrival, pulse of 100, improved to 70 on recheck.  Lab Tests/Imaging studies: I personally interpreted labs/imaging and the pertinent results include: Negative troponin x 1 in context of tachycardia symptoms ongoing for several hours prior to arrival, with no active chest pain.  CBC unremarkable, CMP notable for very mild hypokalemia potassium 3.4, I independently interpreted plain film chest x-ray shows no evidence of acute intrathoracic abnormality. I agree with the radiologist interpretation.  Cardiac monitoring: EKG  obtained and interpreted by myself and attending physician which shows: Normal sinus rhythm with sinus arrhythmia, no tachycardia noted.   Medications: Negative by PERC criteria, symptom-free in ED, unclear what is causing his symptoms, discussed I would recommend increase fluid intake, decreased energy drink usage, and cardiology follow-up for heart monitor as needed, given benign lab and imaging findings, improvement of vital signs and resolution of symptoms no need for additional workup in the ED at this time. Disposition: After consideration of the diagnostic results and the patients response to treatment, I feel that patient is stable  for discharge with plan as above..   emergency department workup does not suggest an emergent condition requiring admission or immediate intervention beyond what has been performed at this time. The plan is: as above. The patient is safe for discharge and has been instructed to return immediately for worsening symptoms, change in symptoms or any other concerns.  Final Clinical Impression(s) / ED Diagnoses Final diagnoses:  Palpitations  Tachycardia    Rx / DC Orders ED Discharge Orders          Ordered    Ambulatory referral to Cardiology        08/13/23 1943              [place] 08/13/23 1953    Gloris Manchester, MD 08/13/23 2039

## 2023-08-13 NOTE — ED Triage Notes (Addendum)
 Pt bib pov w/ c/o a fast heart rate. Pt stated he could get it down by deep breathing exercises but it would go right back up. Pt endorses sob, nausea and dizziness. Pt denies any LOC or sweating. Pt reports during tachy events he experiences chest pain. PT denies any new medications or drug use but did have one energy drink this morning. This is not an unusual beverage for this pt.

## 2023-08-13 NOTE — Discharge Instructions (Signed)
 I would cut back on your caffeine intake and drink plenty of fluids, you should receive a call from the cardiologist to schedule a follow-up appointment if these symptoms persist.  If you develop worsening chest pain or severe elevated heart rate that does not go away with rest I recommend returning to the emergency department for further evaluation and management.

## 2023-10-09 ENCOUNTER — Ambulatory Visit: Payer: MEDICAID | Attending: Internal Medicine | Admitting: Internal Medicine

## 2023-10-09 ENCOUNTER — Encounter: Payer: Self-pay | Admitting: Internal Medicine

## 2023-10-09 VITALS — BP 126/84 | HR 87 | Ht 72.0 in | Wt 183.6 lb

## 2023-10-09 DIAGNOSIS — R002 Palpitations: Secondary | ICD-10-CM | POA: Insufficient documentation

## 2023-10-09 DIAGNOSIS — Z720 Tobacco use: Secondary | ICD-10-CM | POA: Insufficient documentation

## 2023-10-09 NOTE — Progress Notes (Signed)
 Cardiology Office Note  Date: 10/09/2023   ID: Marc Porter, DOB Aug 02, 1995, MRN [id number]  PCP:  Patient, No Pcp Per  Cardiologist:  Lasalle Pointer, MD Electrophysiologist:  None   History of Present Illness: Marc Porter is a 28 y.o. male with no significant PMH was referred to cardiology clinic from the ER for evaluation of palpitations.  Patient had sudden onset of palpitations and chest pains prompting ER visit in March 2025.  He was smoking marijuana, drinking energy drinks and coffee until that ER visit.  Since discharge from ER, he did not touch any of them.  He smokes 1 pack cigarettes daily.  After discharge from the ER, he had 2 episodes of palpitations where the HR was 120 bpm.  This happened at rest.  Otherwise no major palpitation episodes.  No chest pains either after discharge from the ER.  He does have SOB mainly from smoking but he can do all his ADLs and chores with no issues.  No dizziness, palpitations, lightheadedness, leg swelling.  No past medical history on file.  Past Surgical History:  Procedure Laterality Date   TONSILLECTOMY     TONSILLECTOMY AND ADENOIDECTOMY      No current outpatient medications on file.   No current facility-administered medications for this visit.   Allergies:  Patient has no known allergies.   Social History: The patient  reports that he has been smoking cigarettes. He has quit using smokeless tobacco.  His smokeless tobacco use included chew. He reports current alcohol use. He reports current drug use. Drug: Marijuana.   Family History: The patient's family history is not on file.   ROS:  Please see the history of present illness. Otherwise, complete review of systems is positive for none  All other systems are reviewed and negative.   Physical Exam: VS:  BP 126/84   Pulse 87   Ht 6' (1.829 m)   Wt 183 lb 9.6 oz (83.3 kg)   SpO2 99%   BMI 24.90 kg/m , BMI Body mass index is 24.9 kg/m.  Wt Readings from  Last 3 Encounters:  10/09/23 183 lb 9.6 oz (83.3 kg)  08/13/23 175 lb (79.4 kg)  01/23/23 175 lb (79.4 kg)    General: Patient appears comfortable at rest. HEENT: Conjunctiva and lids normal, oropharynx clear with moist mucosa. Neck: Supple, no elevated JVP or carotid bruits, no thyromegaly. Lungs: Clear to auscultation, nonlabored breathing at rest. Cardiac: Regular rate and rhythm, no S3 or significant systolic murmur, no pericardial rub. Abdomen: Soft, nontender, no hepatomegaly, bowel sounds present, no guarding or rebound. Extremities: No pitting edema, distal pulses 2+. Skin: Warm and dry. Musculoskeletal: No kyphosis. Neuropsychiatric: Alert and oriented x3, affect grossly appropriate.  Recent Labwork: 01/23/2023: Magnesium 2.0 08/13/2023: ALT 17; AST 15; BUN 11; Creatinine, Ser 0.90; Hemoglobin 14.2; Platelets 211; Potassium 3.4; Sodium 136  No results found for: "CHOL", "TRIG", "HDL", "CHOLHDL", "VLDL", "LDLCALC", "LDLDIRECT"   Assessment and Plan:   Palpitations: Patient had sudden onset of palpitations at rest prompting ER visit in March 2025. He used to drink energy drinks, smoke marijuana, take coffee prior to ER visit but after counseling in the ER, he did not consume any of energy drinks, marijuana, coffee etc. after discharge from the ER with no recurrence of palpitations except on 2 occasions where his heart rate went up to 120 bpm at rest. Otherwise denies having any chest pain.  Congratulated and counseled him not to take energy drinks  or take caffeinated products.  He also quit smoking marijuana.  Nicotine abuse: Smokes 1 pack/day. Counseling provided. He does not have a PCP now.  Instructed him to establish care with PCP.      Medication Adjustments/Labs and Tests Ordered: Current medicines are reviewed at length with the patient today.  Concerns regarding medicines are outlined above.    Disposition:  Follow up prn  Signed Numan Zylstra Beauford Bounds,  MD, 10/09/2023 11:02 AM    [facility] at [facility] [address] [place], [place], [place] [address]

## 2023-10-09 NOTE — Patient Instructions (Addendum)
 Medication Instructions:  Your physician recommends that you continue on your current medications as directed. Please refer to the Current Medication list given to you today.   Labwork: None  Testing/Procedures: None  Follow-Up: Your physician recommends that you schedule a follow-up appointment in: Follow up as Needed  Any Other Special Instructions Will Be Listed Below (If Applicable). Thank you for choosing [facility]!     If you need a refill on your cardiac medications before your next appointment, please call your pharmacy.

## 2023-12-05 ENCOUNTER — Other Ambulatory Visit: Payer: Self-pay

## 2023-12-05 ENCOUNTER — Emergency Department (HOSPITAL_COMMUNITY): Payer: Self-pay

## 2023-12-05 ENCOUNTER — Emergency Department (HOSPITAL_COMMUNITY)
Admission: EM | Admit: 2023-12-05 | Discharge: 2023-12-05 | Disposition: A | Discharge status: Home / Self Care | Payer: Self-pay | Attending: Emergency Medicine | Admitting: Emergency Medicine

## 2023-12-05 DIAGNOSIS — M24541 Contracture, right hand: Secondary | ICD-10-CM | POA: Insufficient documentation

## 2023-12-05 LAB — CBC WITH DIFFERENTIAL/PLATELET
Abs Immature Granulocytes: 0.01 K/uL (ref 0.00–0.07)
Basophils Absolute: 0 K/uL (ref 0.0–0.1)
Basophils Relative: 1 %
Eosinophils Absolute: 0.1 K/uL (ref 0.0–0.5)
Eosinophils Relative: 2 %
HCT: 41.9 % (ref 39.0–52.0)
Hemoglobin: 14.8 g/dL (ref 13.0–17.0)
Immature Granulocytes: 0 %
Lymphocytes Relative: 31 %
Lymphs Abs: 1.4 K/uL (ref 0.7–4.0)
MCH: 31.6 pg (ref 26.0–34.0)
MCHC: 35.3 g/dL (ref 30.0–36.0)
MCV: 89.3 fL (ref 80.0–100.0)
Monocytes Absolute: 0.4 K/uL (ref 0.1–1.0)
Monocytes Relative: 9 %
Neutro Abs: 2.6 K/uL (ref 1.7–7.7)
Neutrophils Relative %: 57 %
Platelets: 249 K/uL (ref 150–400)
RBC: 4.69 MIL/uL (ref 4.22–5.81)
RDW: 12 % (ref 11.5–15.5)
WBC: 4.5 K/uL (ref 4.0–10.5)
nRBC: 0 % (ref 0.0–0.2)

## 2023-12-05 LAB — BASIC METABOLIC PANEL WITH GFR
Anion gap: 9 (ref 5–15)
BUN: 17 mg/dL (ref 6–20)
CO2: 25 mmol/L (ref 22–32)
Calcium: 8.8 mg/dL — ABNORMAL LOW (ref 8.9–10.3)
Chloride: 105 mmol/L (ref 98–111)
Creatinine, Ser: 1 mg/dL (ref 0.61–1.24)
GFR, Estimated: >60 mL/min (ref >60)
Glucose, Bld: 99 mg/dL (ref 70–99)
Potassium: 4 mmol/L (ref 3.5–5.1)
Sodium: 139 mmol/L (ref 135–145)

## 2023-12-05 MED ORDER — CLOTRIMAZOLE 1 % EX CREA
TOPICAL_CREAM | CUTANEOUS | 0 refills | Status: AC
Start: 2023-12-05 — End: ?

## 2023-12-05 MED ORDER — DOXYCYCLINE HYCLATE 100 MG PO CAPS: 100.0000 mg | ORAL_CAPSULE | Freq: Two times a day (BID) | ORAL | 0 refills | Status: DC

## 2023-12-05 MED ORDER — DOXYCYCLINE HYCLATE 100 MG PO TABS
100.0000 mg | ORAL_TABLET | Freq: Once | ORAL | Status: AC
  Administered 2023-12-05: 100 mg via ORAL
  Filled 2023-12-05: qty 1

## 2023-12-05 NOTE — ED Provider Notes (Signed)
 [place] EMERGENCY DEPARTMENT AT [facility] Provider Note   CSN: [id number] Arrival date & time: 12/05/23  0005     Patient presents with: Hand Problem   Marc Porter is a 28 y.o. male.   HPI     This is a 28 year old male who presents with concern for right finger wounds and injury.  Patient reports that for the last 5 months he has had wounds and flexion of the right fourth digit.  He is right-handed.  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  Thinks he may have burned the digit.  He now has difficulty extending that digit.  He has wounds over the digit.  He reports using topical antibiotic ointment with no relief.  Comes tonight because his mother was concerned about the way it looked.  Does not have any rashes elsewhere.  Prior to Admission medications   Medication Sig Start Date End Date Taking? Authorizing Provider  clotrimazole  (LOTRIMIN ) 1 % cream Apply to affected area 2 times daily 12/05/23  Yes Amish Mintzer, Charmaine FALCON, MD  doxycycline  (VIBRAMYCIN ) 100 MG capsule Take 1 capsule (100 mg total) by mouth 2 (two) times daily. 12/05/23  Yes Tycho Cheramie, Charmaine FALCON, MD    Allergies: Patient has no known allergies.    Review of Systems  Constitutional:  Negative for fever.  Musculoskeletal:  Positive for joint swelling.  Skin:  Positive for rash and wound.  All other systems reviewed and are negative.   Updated Vital Signs BP 127/77   Pulse 87   Temp 98.1 F (36.7 C)   Resp 16   Ht 1.829 m (6')   Wt 83.9 kg   SpO2 98%   BMI 25.09 kg/m   Physical Exam Vitals and nursing note reviewed.  Constitutional:      Appearance: He is well-developed. He is not ill-appearing.  HENT:     Head: Normocephalic and atraumatic.  Eyes:     Pupils: Pupils are equal, round, and reactive to light.  Cardiovascular:     Rate and Rhythm: Normal rate and regular rhythm.  Pulmonary:     Effort: Pulmonary effort is normal.  No respiratory distress.  Abdominal:     Palpations: Abdomen is soft.  Musculoskeletal:     Cervical back: Neck supple.     Comments: Focused examination of the right hand with slight swelling noted of the entire fourth digit, chronic skin changes and tightening noted with slight flexion at the PIP joint, difficulty with extension, at the base of the finger there is cracking and erythema within the webspace  Lymphadenopathy:     Cervical: No cervical adenopathy.  Skin:    General: Skin is warm and dry.  Neurological:     Mental Status: He is alert and oriented to person, place, and time.     (all labs ordered are listed, but only abnormal results are displayed) Labs Reviewed  BASIC METABOLIC PANEL WITH GFR - Abnormal; Notable for the following components:      Result Value   Calcium 8.8 (*)    All other components within normal limits  CBC WITH DIFFERENTIAL/PLATELET    EKG: None  Radiology: DG Finger Ring Right Result Date: 12/05/2023 CLINICAL DATA:  Fourth digit swelling, initial encounter EXAM: RIGHT RING FINGER 2+V COMPARISON:  None Available. FINDINGS: No acute fracture or dislocation is noted. No erosive changes are seen. No foreign body is noted.  No definitive soft tissue abnormality is seen. IMPRESSION: No acute abnormality noted. Electronically Signed   By: Oneil Devonshire M.D.   On: 12/05/2023 01:31     Procedures   Medications Ordered in the ED  doxycycline  (VIBRA -TABS) tablet 100 mg (has no administration in time range)                                    Medical Decision Making Amount and/or Complexity of Data Reviewed Labs: ordered. Radiology: ordered.  Risk Prescription drug management.   This patient presents to the ED for concern of finger wound, this involves an extensive number of treatment options, and is a complaint that carries with it a high risk of complications and morbidity.  I considered the following differential and admission for this acute,  potentially life threatening condition.  The differential diagnosis includes bacterial infection, fungal infection, chronic wound, contracture related to the injury, osteo-  MDM:    This is a 28 year old male who presents with right fourth digit wound and redness.  He is nontoxic and vital signs are reassuring.  Denies systemic symptoms.  He has a slight contracture of that extremity and it looks like he may have had a prior burn causing some skin thickening.  He also has some skin breakdown and cracking at the web spacing.  X-rays do not show any evidence of bone breakdown or osteomyelitis.  White count is normal.  No metabolic derangements.  Will cover with doxycycline  and recommend clotrimazole  for possible fungal component given cracking at the base of the finger.  Hand surgery follow-up provided given appearance of contracture.  Suspect that this is chronic in nature.  (Labs, imaging, consults)  Labs: I Ordered, and personally interpreted labs.  The pertinent results include: CBC, BMP  Imaging Studies ordered: I ordered imaging studies including x-ray I independently visualized and interpreted imaging. I agree with the radiologist interpretation  Additional history obtained from chart review.  External records from outside source obtained and reviewed including prior evaluations  Cardiac Monitoring: The patient was not maintained on a cardiac monitor.  If on the cardiac monitor, I personally viewed and interpreted the cardiac monitored which showed an underlying rhythm of: In a  Reevaluation: After the interventions noted above, I reevaluated the patient and found that they have :stayed the same  Social Determinants of Health:  lives independently  Disposition: Discharge  Co morbidities that complicate the patient evaluation No past medical history on file.   Medicines Meds ordered this encounter  Medications   doxycycline  (VIBRA -TABS) tablet 100 mg   doxycycline  (VIBRAMYCIN )  100 MG capsule    Sig: Take 1 capsule (100 mg total) by mouth 2 (two) times daily.    Dispense:  20 capsule    Refill:  0   clotrimazole  (LOTRIMIN ) 1 % cream    Sig: Apply to affected area 2 times daily    Dispense:  15 g    Refill:  0    I have reviewed the patients home medicines and have made adjustments as needed  Problem List / ED Course: Problem List Items Addressed This Visit   None Visit Diagnoses       Contracture of joint of finger of right hand    -  Primary                Final diagnoses:  Contracture of joint of finger of right hand  ED Discharge Orders          Ordered    doxycycline  (VIBRAMYCIN ) 100 MG capsule  2 times daily        12/05/23 0155    clotrimazole  (LOTRIMIN ) 1 % cream        12/05/23 0155               Nain Rudd, Charmaine FALCON, MD 12/05/23 [phone]

## 2023-12-05 NOTE — ED Triage Notes (Addendum)
 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 Pt reports R ring finger now worse, cannot bend it and is now raw.

## 2023-12-05 NOTE — ED Notes (Signed)
 X-ray at bedside

## 2023-12-05 NOTE — ED Notes (Signed)
 ED Provider at bedside.

## 2023-12-05 NOTE — Discharge Instructions (Signed)
 You were seen today for an acute on chronic issue with your hand.  You appear to have a contracture.  This can sometimes happen if you have burned your hand or finger.  You may also have an overlying infection or fungal infection.  Use medications as prescribed.  Follow-up with hand surgery.  Given that this is your dominant hand, you want to make sure that you are following up appropriately so that you do not have a chronic issue.

## 2024-01-31 ENCOUNTER — Encounter (HOSPITAL_COMMUNITY): Payer: Self-pay

## 2024-01-31 ENCOUNTER — Other Ambulatory Visit: Payer: Self-pay

## 2024-01-31 ENCOUNTER — Emergency Department (HOSPITAL_COMMUNITY)
Admission: EM | Admit: 2024-01-31 | Discharge: 2024-01-31 | Disposition: A | Discharge status: Home / Self Care | Payer: Self-pay | Attending: Emergency Medicine | Admitting: Emergency Medicine

## 2024-01-31 DIAGNOSIS — R000 Tachycardia, unspecified: Secondary | ICD-10-CM | POA: Insufficient documentation

## 2024-01-31 DIAGNOSIS — F17210 Nicotine dependence, cigarettes, uncomplicated: Secondary | ICD-10-CM | POA: Insufficient documentation

## 2024-01-31 LAB — CBC WITH DIFFERENTIAL/PLATELET
Abs Immature Granulocytes: 0.02 K/uL (ref 0.00–0.07)
Basophils Absolute: 0 K/uL (ref 0.0–0.1)
Basophils Relative: 0 %
Eosinophils Absolute: 0.1 K/uL (ref 0.0–0.5)
Eosinophils Relative: 1 %
HCT: 42.5 % (ref 39.0–52.0)
Hemoglobin: 15.1 g/dL (ref 13.0–17.0)
Immature Granulocytes: 0 %
Lymphocytes Relative: 16 %
Lymphs Abs: 1 K/uL (ref 0.7–4.0)
MCH: 30.8 pg (ref 26.0–34.0)
MCHC: 35.5 g/dL (ref 30.0–36.0)
MCV: 86.6 fL (ref 80.0–100.0)
Monocytes Absolute: 0.5 K/uL (ref 0.1–1.0)
Monocytes Relative: 8 %
Neutro Abs: 4.6 K/uL (ref 1.7–7.7)
Neutrophils Relative %: 75 %
Platelets: 235 K/uL (ref 150–400)
RBC: 4.91 MIL/uL (ref 4.22–5.81)
RDW: 11.6 % (ref 11.5–15.5)
WBC: 6.1 K/uL (ref 4.0–10.5)
nRBC: 0 % (ref 0.0–0.2)

## 2024-01-31 LAB — BASIC METABOLIC PANEL WITH GFR
Anion gap: 10 (ref 5–15)
BUN: 15 mg/dL (ref 6–20)
CO2: 25 mmol/L (ref 22–32)
Calcium: 9.4 mg/dL (ref 8.9–10.3)
Chloride: 103 mmol/L (ref 98–111)
Creatinine, Ser: 0.98 mg/dL (ref 0.61–1.24)
GFR, Estimated: >60 mL/min (ref >60)
Glucose, Bld: 110 mg/dL — ABNORMAL HIGH (ref 70–99)
Potassium: 3.7 mmol/L (ref 3.5–5.1)
Sodium: 138 mmol/L (ref 135–145)

## 2024-01-31 LAB — MAGNESIUM: Magnesium: 2.1 mg/dL (ref 1.7–2.4)

## 2024-01-31 NOTE — ED Provider Notes (Signed)
 Emergency Department Provider Note  TRIAGE NOTE: Pov from home. Cc of tachycardia. Says at home it was 130. Called ems and they did an EKG and he said he was told he had a skipped beat. Says that now he just doesn't feel right  after he took a nerve pill a xanax.  Hx of anxiety   HISTORY  Chief Complaint Tachycardia   HPI Marc Porter is a 28 y.o. male with  a chief complaint of palpitations and feeling off since approximately 1:00 AM today. The patient reports a rapid heart rate of 136 beats per minute, which has since slowed, but they continue to feel uneasy with a sensation of stomach discomfort. The patient describes experiencing mild chest pain and attempted to alleviate symptoms by taking deep breaths and using a Xanax obtained from their mother-in-law. The patient has a history of similar episodes and has previously consulted a cardiologist, who reported no significant findings. The patient admits to smoking cigarettes, consuming caffeine [facility]), and occasional marijuana use, including using a THC pen around 10:00 or 11:00 PM last night. The patient denies recent illness, although they mention a scratchy throat. There is no reported history of anxiety or recent over-the-counter medication use. The patient has attempted lifestyle modifications, such as reducing caffeine and cigarette consumption. History was obtained from the patient.  PMH History reviewed. No pertinent past medical history.  Home Medications Prior to Admission medications   Medication Sig Start Date End Date Taking? Authorizing Provider  clotrimazole  (LOTRIMIN ) 1 % cream Apply to affected area 2 times daily 12/05/23   Horton, Charmaine FALCON, MD  doxycycline  (VIBRAMYCIN ) 100 MG capsule Take 1 capsule (100 mg total) by mouth 2 (two) times daily. 12/05/23   Horton, Charmaine FALCON, MD    Social History Social History   Tobacco Use   Smoking status: Every Day    Current packs/day: 0.50    Types: Cigarettes    Smokeless tobacco: Former    Types: Associate Professor status: Never Used  Substance Use Topics   Alcohol use: Yes    Comment: occasionally   Drug use: Yes    Types: Marijuana    Review of Systems: Documented in HPI ____________________________________________  PHYSICAL EXAM: VITAL SIGNS: Triage: Blood pressure 117/72, pulse 81, temperature 98 F (36.7 C), resp. rate 17, height 6' 0.75 (1.848 m), weight 83.9 kg, SpO2 93%.  Vitals:   01/31/24 0400 01/31/24 0408 01/31/24 0430 01/31/24 0500  BP: 124/73  118/72 117/72  Pulse: 85  81 81  Resp: (!) 9  (!) 9 17  Temp:      SpO2: 96% 98% 98% 93%  Weight:      Height:        Physical Exam Vitals and nursing note reviewed.  Constitutional:      Appearance: He is well-developed.  HENT:     Head: Normocephalic and atraumatic.  Cardiovascular:     Rate and Rhythm: Normal rate.  Pulmonary:     Effort: Pulmonary effort is normal. No respiratory distress.  Abdominal:     General: There is no distension.  Musculoskeletal:        General: Normal range of motion.     Cervical back: Normal range of motion.  Neurological:     Mental Status: He is alert.       ____________________________________________   LABS (all labs ordered are listed, but only abnormal results are displayed)  Labs Reviewed  BASIC METABOLIC PANEL  WITH GFR - Abnormal; Notable for the following components:      Result Value   Glucose, Bld 110 (*)    All other components within normal limits  CBC WITH DIFFERENTIAL/PLATELET  MAGNESIUM   ____________________________________________  EKG   EKG Interpretation Date/Time:  Thursday January 31 2024 03:53:23 EDT Ventricular Rate:  77 PR Interval:  130 QRS Duration:  88 QT Interval:  356 QTC Calculation: 403 R Axis:   68  Text Interpretation: Sinus rhythm Borderline T abnormalities, inferior leads Confirmed by Lorette Mayo [phone]) on 01/31/2024 4:59:09 AM         ____________________________________________  RADIOLOGY  No results found. ____________________________________________  PROCEDURES  Procedure(s) performed:   Procedures ____________________________________________  INITIAL IMPRESSION / ASSESSMENT AND PLAN     Initial DDx:        ED Course   Patient presents with a history of palpitations and slight chest pain at 1:00 AM after smoking a THC pen, feeling persistently off despite slowing heart rate. Plan:  Reassure patient as current EKG shows sinus tachycardia with no arrhythmia. Perform basic assessments to rule out other causes and ensure no immediate interventions are required. Advise follow-up with cardiologist for further evaluation and potential monitoring. Discuss lifestyle modifications, including reducing caffeine and tobacco.  Mag normal K normal ECG normal Low suspicion for PE, malignant arrhythmia, hyperthyroidism, infection, dehydration or other obvious causes. Discussed life style modifications.       Images ordered viewed and obtained by myself. Agree with Radiology interpretation. Details in ED course.  Labs ordered reviewed by myself as detailed in ED course.  Consultations obtained/considered detailed in ED course.     FINAL IMPRESSION Final diagnoses:  Tachycardia     Disposition A medical screening exam was performed and I feel the patient has had an appropriate workup for their chief complaint at this time and likelihood of emergent condition existing is low. They have been counseled on decision, DISCHARGE, follow up and which symptoms necessitate immediate return to the emergency department. They or their family verbally stated understanding and agreement with plan and discharged in stable condition.   ____________________________________________   NEW OUTPATIENT MEDICATIONS STARTED DURING THIS VISIT:  Discharge Medication List as of 01/31/2024  5:12 AM      Note:  This note was  prepared with assistance of Dragon voice recognition software. Occasional wrong-word or sound-a-like substitutions may have occurred due to the inherent limitations of voice recognition software.    Lorette Mayo, MD 01/31/24 [phone]

## 2024-01-31 NOTE — ED Triage Notes (Addendum)
 Pov from home. Cc of tachycardia. Says at home it was 130. Called ems and they did an EKG and he said he was told he had a skipped beat. Says that now he just doesn't feel right  after he took a nerve pill a xanax.  Hx of anxiety

## 2024-04-06 ENCOUNTER — Emergency Department (HOSPITAL_COMMUNITY)
Admission: EM | Admit: 2024-04-06 | Discharge: 2024-04-06 | Disposition: A | Discharge status: Home / Self Care | Payer: Self-pay | Attending: Emergency Medicine | Admitting: Emergency Medicine

## 2024-04-06 ENCOUNTER — Encounter (HOSPITAL_COMMUNITY): Payer: Self-pay | Admitting: Emergency Medicine

## 2024-04-06 ENCOUNTER — Other Ambulatory Visit: Payer: Self-pay

## 2024-04-06 ENCOUNTER — Emergency Department (HOSPITAL_COMMUNITY): Payer: Self-pay

## 2024-04-06 DIAGNOSIS — R079 Chest pain, unspecified: Secondary | ICD-10-CM | POA: Insufficient documentation

## 2024-04-06 DIAGNOSIS — R202 Paresthesia of skin: Secondary | ICD-10-CM | POA: Insufficient documentation

## 2024-04-06 DIAGNOSIS — F17210 Nicotine dependence, cigarettes, uncomplicated: Secondary | ICD-10-CM | POA: Insufficient documentation

## 2024-04-06 DIAGNOSIS — R1030 Lower abdominal pain, unspecified: Secondary | ICD-10-CM | POA: Insufficient documentation

## 2024-04-06 LAB — COMPREHENSIVE METABOLIC PANEL WITH GFR
ALT: 11 U/L (ref 0–44)
AST: 15 U/L (ref 15–41)
Albumin: 4.4 g/dL (ref 3.5–5.0)
Alkaline Phosphatase: 109 U/L (ref 38–126)
Anion gap: 10 (ref 5–15)
BUN: 13 mg/dL (ref 6–20)
CO2: 25 mmol/L (ref 22–32)
Calcium: 9.1 mg/dL (ref 8.9–10.3)
Chloride: 104 mmol/L (ref 98–111)
Creatinine, Ser: 1.11 mg/dL (ref 0.61–1.24)
GFR, Estimated: >60 mL/min (ref >60)
Glucose, Bld: 105 mg/dL — ABNORMAL HIGH (ref 70–99)
Potassium: 4.1 mmol/L (ref 3.5–5.1)
Sodium: 138 mmol/L (ref 135–145)
Total Bilirubin: 0.4 mg/dL (ref 0.0–1.2)
Total Protein: 6.6 g/dL (ref 6.5–8.1)

## 2024-04-06 LAB — CBC
HCT: 41.1 % (ref 39.0–52.0)
Hemoglobin: 14.5 g/dL (ref 13.0–17.0)
MCH: 30.7 pg (ref 26.0–34.0)
MCHC: 35.3 g/dL (ref 30.0–36.0)
MCV: 87.1 fL (ref 80.0–100.0)
Platelets: 239 K/uL (ref 150–400)
RBC: 4.72 MIL/uL (ref 4.22–5.81)
RDW: 11.9 % (ref 11.5–15.5)
WBC: 4.1 K/uL (ref 4.0–10.5)
nRBC: 0 % (ref 0.0–0.2)

## 2024-04-06 LAB — URINALYSIS, ROUTINE W REFLEX MICROSCOPIC
Bilirubin Urine: NEGATIVE
Glucose, UA: NEGATIVE mg/dL
Hgb urine dipstick: NEGATIVE
Ketones, ur: NEGATIVE mg/dL
Leukocytes,Ua: NEGATIVE
Nitrite: NEGATIVE
Protein, ur: NEGATIVE mg/dL
Specific Gravity, Urine: 1.006 (ref 1.005–1.030)
pH: 7 (ref 5.0–8.0)

## 2024-04-06 LAB — LIPASE, BLOOD: Lipase: 16 U/L (ref 11–51)

## 2024-04-06 LAB — TROPONIN T, HIGH SENSITIVITY
Troponin T High Sensitivity: <15 ng/L (ref 0–19)
Troponin T High Sensitivity: <15 ng/L (ref 0–19)

## 2024-04-06 MED ORDER — IOHEXOL 350 MG/ML SOLN
100.0000 mL | Freq: Once | INTRAVENOUS | Status: AC | PRN
  Administered 2024-04-06: 100 mL via INTRAVENOUS

## 2024-04-06 MED ORDER — DICYCLOMINE HCL 20 MG PO TABS: 20.0000 mg | ORAL_TABLET | Freq: Two times a day (BID) | ORAL | 0 refills | Status: AC

## 2024-04-06 MED ORDER — ONDANSETRON 4 MG PO TBDP: 4.0000 mg | ORAL_TABLET | Freq: Three times a day (TID) | ORAL | 0 refills | Status: AC | PRN

## 2024-04-06 NOTE — ED Triage Notes (Signed)
 Pt with c/o abdominal pain and overall just not feeling well. States has pain down L arm intermittently.

## 2024-04-06 NOTE — ED Provider Notes (Signed)
 [facility]-EMERGENCY DEPT [facility] Emergency Department Provider Note MRN:  [id number]  Arrival date & time: 04/06/24     Chief Complaint   Abdominal Pain   History of Present Illness   Marc Porter is a 28 y.o. year-old male with no pertinent past medical history presenting to the ED with chief complaint of abdominal pain.  Woke up with significant pain to the abdomen around the bellybutton, also having tingling and pain to the left arm.  Has been having some chest pain throughout the day today.  Review of Systems  A thorough review of systems was obtained and all systems are negative except as noted in the HPI and PMH.   Patient's Health History   History reviewed. No pertinent past medical history.  Past Surgical History:  Procedure Laterality Date   TONSILLECTOMY     TONSILLECTOMY AND ADENOIDECTOMY      History reviewed. No pertinent family history.  Social History   Socioeconomic History   Marital status: Married    Spouse name: Not on file   Number of children: Not on file   Years of education: Not on file   Highest education level: Not on file  Occupational History   Not on file  Tobacco Use   Smoking status: Every Day    Current packs/day: 0.50    Types: Cigarettes   Smokeless tobacco: Former    Types: Engineer, Drilling   Vaping status: Never Used  Substance and Sexual Activity   Alcohol use: Yes    Comment: occasionally   Drug use: Yes    Types: Marijuana   Sexual activity: Not on file  Other Topics Concern   Not on file  Social History Narrative   Not on file   Social Drivers of Health   Financial Resource Strain: Not on file  Food Insecurity: Not on file  Transportation Needs: Not on file  Physical Activity: Not on file  Stress: Not on file  Social Connections: Not on file  Intimate Partner Violence: Not on file     Physical Exam   Vitals:   04/06/24 0208 04/06/24 0230  BP: (!) 154/99 134/89  Pulse: 84 84  Resp: 20 13  Temp: 97.8  F (36.6 C)   SpO2: 99% 100%    CONSTITUTIONAL: Well-appearing, NAD NEURO/PSYCH:  Alert and oriented x 3, no focal deficits EYES:  eyes equal and reactive ENT/NECK:  no LAD, no JVD CARDIO: Regular rate, well-perfused, normal S1 and S2 PULM:  CTAB no wheezing or rhonchi GI/GU:  non-distended, non-tender MSK/SPINE:  No gross deformities, no edema SKIN:  no rash, atraumatic   *Additional and/or pertinent findings included in MDM below  Diagnostic and Interventional Summary    EKG Interpretation Date/Time:  Sunday April 06 2024 02:11:39 EST Ventricular Rate:  82 PR Interval:  130 QRS Duration:  87 QT Interval:  345 QTC Calculation: 403 R Axis:   71  Text Interpretation: Sinus rhythm Nonspecific T abnormalities, inferior leads Confirmed by Theadore Sharper [phone]) on 04/06/2024 4:21:16 AM       Labs Reviewed  COMPREHENSIVE METABOLIC PANEL WITH GFR - Abnormal; Notable for the following components:      Result Value   Glucose, Bld 105 (*)    All other components within normal limits  URINALYSIS, ROUTINE W REFLEX MICROSCOPIC - Abnormal; Notable for the following components:   Color, Urine STRAW (*)    All other components within normal limits  LIPASE, BLOOD  CBC  TROPONIN T,  HIGH SENSITIVITY  TROPONIN T, HIGH SENSITIVITY    CT Angio Chest/Abd/Pel for Dissection W and/or Wo Contrast  Final Result      Medications  iohexol (OMNIPAQUE) 350 MG/ML injection 100 mL (100 mLs Intravenous Contrast Given 04/06/24 0314)     Procedures  /  Critical Care Procedures  ED Course and Medical Decision Making  Initial Impression and Ddx Patient is tender in the lower abdomen and so appendicitis is considered.  However with the prodrome of chest pain followed by lower abdominal pain and also this pain and paresthesia to the left arm, aortic pathology is also considered.  Past medical/surgical history that increases complexity of ED encounter: None  Interpretation of Diagnostics I  personally reviewed the EKG and my interpretation is as follows: Sinus rhythm nonspecific findings  No significant blood count or electrolyte disturbance.  Troponin negative x 2  Patient Reassessment and Ultimate Disposition/Management     CT unremarkable, no dissection, no appendicitis, no emergent process.  Patient feeling better, normal vitals, no indication for further testing or admission.  Discharged home.  Patient management required discussion with the following services or consulting groups:  None  Complexity of Problems Addressed Acute illness or injury that poses threat of life of bodily function  Additional Data Reviewed and Analyzed Further history obtained from: Further history from spouse/family member  Additional Factors Impacting ED Encounter Risk Consideration of hospitalization  Marc Porter. Theadore, MD [facility] Emergency Medicine [facility] [email or web address]  Final Clinical Impressions(s) / ED Diagnoses     ICD-10-CM   1. Lower abdominal pain  R10.30       ED Discharge Orders          Ordered    dicyclomine (BENTYL) 20 MG tablet  2 times daily        04/06/24 0448    ondansetron  (ZOFRAN -ODT) 4 MG disintegrating tablet  Every 8 hours PRN        04/06/24 0448             Discharge Instructions Discussed with and Provided to Patient:     Discharge Instructions      You were evaluated in the Emergency Department and after careful evaluation, we did not find any emergent condition requiring admission or further testing in the hospital.  Your exam/testing today is overall reassuring.  Can use the Zofran  as needed for nausea, can use the Bentyl as needed for crampy abdominal pain.  Please return to the Emergency Department if you experience any worsening of your condition.   Thank you for allowing us  to be a part of your care.       Theadore Marc HERO, MD 04/06/24 [phone]

## 2024-04-06 NOTE — Discharge Instructions (Signed)
 You were evaluated in the Emergency Department and after careful evaluation, we did not find any emergent condition requiring admission or further testing in the hospital.  Your exam/testing today is overall reassuring.  Can use the Zofran  as needed for nausea, can use the Bentyl as needed for crampy abdominal pain.  Please return to the Emergency Department if you experience any worsening of your condition.   Thank you for allowing us  to be a part of your care.

## 2024-04-06 NOTE — ED Notes (Signed)
 Discharge instructions reviewed.   Newly prescribed medications discussed. Pharmacy verified.   Opportunity for questions and concerns provided.   Alert, oriented and ambulatory.   Displays no signs of distress.

## 2024-05-27 ENCOUNTER — Encounter (HOSPITAL_COMMUNITY): Payer: Self-pay | Admitting: *Deleted

## 2024-05-27 ENCOUNTER — Other Ambulatory Visit: Payer: Self-pay

## 2024-05-27 ENCOUNTER — Emergency Department (HOSPITAL_COMMUNITY)
Admission: EM | Admit: 2024-05-27 | Discharge: 2024-05-27 | Disposition: A | Discharge status: Home / Self Care | Payer: Self-pay | Attending: Emergency Medicine | Admitting: Emergency Medicine

## 2024-05-27 ENCOUNTER — Emergency Department (HOSPITAL_COMMUNITY): Payer: Self-pay

## 2024-05-27 DIAGNOSIS — L080 Pyoderma: Secondary | ICD-10-CM | POA: Insufficient documentation

## 2024-05-27 MED ORDER — SULFAMETHOXAZOLE-TRIMETHOPRIM 800-160 MG PO TABS: 1.0000 | ORAL_TABLET | Freq: Two times a day (BID) | ORAL | 0 refills | Status: AC

## 2024-05-27 MED ORDER — MUPIROCIN CALCIUM 2 % EX CREA
TOPICAL_CREAM | Freq: Two times a day (BID) | CUTANEOUS | Status: DC
  Administered 2024-05-27: 1 via TOPICAL
  Filled 2024-05-27: qty 15

## 2024-05-27 MED ORDER — SULFAMETHOXAZOLE-TRIMETHOPRIM 800-160 MG PO TABS
1.0000 | ORAL_TABLET | Freq: Once | ORAL | Status: AC
  Administered 2024-05-27: 1 via ORAL
  Filled 2024-05-27: qty 1

## 2024-05-27 NOTE — ED Triage Notes (Signed)
 Pt with raised areas to to right and left fingers, pt admits to exposure to wheel cleaner that is an acid per pt.

## 2024-05-27 NOTE — Discharge Instructions (Addendum)
 Take the entire course of the antibiotics prescribed, take 1 more dose before bedtime tonight of the pills.  I also would like you to soak your hands in a warm Epsom salt soak twice daily after washing your hands with mild soap and water  After a 10 minute Epsom salt soak rinse the Epsom salt off your hands before applying a new thin layer of the bacitracin cream given you.  Apply this cream to the areas of infection for the next 10 days.  Expected to take 24 to 48 hours for the antibiotics to start making a substantial improvement in this infection.  Get rechecked here as discussed for any worsening infection, spreading redness or spreading pustular lesions.

## 2024-05-27 NOTE — ED Provider Notes (Signed)
 " [place] EMERGENCY DEPARTMENT AT [facility] Provider Note   CSN: [id number] Arrival date & time: 05/27/24  1059     Patient presents with: No chief complaint on file.   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  He was exposed to a wheel cleaner which is acidic, and states the gloves he was wearing did not adequately protect his hands.  He has had no treatment for his symptoms prior to arrival.  Denies fevers or chills, other complaints.  Patient is right-handed.   The history is provided by the patient.       Prior to Admission medications  Medication Sig Start Date End Date Taking? Authorizing Provider  sulfamethoxazole -trimethoprim  (BACTRIM  DS) 800-160 MG tablet Take 1 tablet by mouth 2 (two) times daily for 10 days. 05/27/24 06/06/24 Yes Britne Borelli, Mliss, PA-C  clotrimazole  (LOTRIMIN ) 1 % cream Apply to affected area 2 times daily 12/05/23   Horton, Charmaine FALCON, MD  dicyclomine  (BENTYL ) 20 MG tablet Take 1 tablet (20 mg total) by mouth 2 (two) times daily. 04/06/24   Theadore Ozell HERO, MD  ondansetron  (ZOFRAN -ODT) 4 MG disintegrating tablet Take 1 tablet (4 mg total) by mouth every 8 (eight) hours as needed for nausea or vomiting. 04/06/24   Theadore Ozell HERO, MD    Allergies: Patient has no known allergies.    Review of Systems  Constitutional:  Negative for chills and fever.  Respiratory:  Negative for shortness of breath and wheezing.   Skin:  Positive for color change and rash.  Neurological:  Negative for numbness.    Updated Vital Signs BP 114/79 (BP Location: Right Arm)   Pulse 87   Temp 98 F (36.7 C) (Oral)   Resp 17   Ht 6' (1.829 m)   Wt 81.6 kg   SpO2 100%   BMI 24.41 kg/m   Physical  Exam Constitutional:      General: He is not in acute distress.    Appearance: He is well-developed.  HENT:     Head: Normocephalic.  Cardiovascular:     Rate and Rhythm: Normal rate.  Pulmonary:     Effort: Pulmonary effort is normal.     Breath sounds: No wheezing.  Musculoskeletal:        General: Normal range of motion.     Cervical back: Neck supple.  Skin:    Findings: Erythema and rash present. Rash is pustular.     (all labs ordered are listed, but only abnormal results are displayed) Labs Reviewed - No data to display  EKG: None  Radiology: DG Finger Middle Right Result Date: 05/27/2024 CLINICAL DATA:  Possible infection. EXAM: RIGHT MIDDLE FINGER 2+V COMPARISON:  12/05/2023 FINDINGS: Bony structures and joint spaces are unremarkable. Possible mild soft tissue swelling at the third PIP joint. No air within the soft tissues. IMPRESSION: No acute bony findings. Possible mild soft tissue swelling at the third PIP joint. Electronically Signed   By: Toribio Agreste M.D.   On: 05/27/2024 15:07     Procedures   Skin was cleaned using wound cleaner spray,  #25 needle used to open the surface of the pustules right long finger,  small amounts of pus expressed.  Pt tolerated well.  Mupicocin applied,  dressing applied.   Medications Ordered in the ED  sulfamethoxazole -trimethoprim  (BACTRIM  DS) 800-160 MG per tablet 1 tablet (has no administration in time range)  mupirocin  cream (BACTROBAN ) 2 % (has no administration in time range)                                    Medical Decision Making Patient presenting with acute on chronic skin irritation secondary to chronic chemical exposures with his job, but now with increased erythema, pain and development of pustules predominantly on his right long finger.  He has no prior history of similar symptoms, he does not have a history of eczema or psoriasis.  His exam suggest a pustular skin infection with cellulitis.  Refer to the  photo.  Amount and/or Complexity of Data Reviewed Radiology: ordered.    Details: Imaging reviewed,  negative for osteomyelitis or obvious bony involvement. No joint effusion.  Risk Prescription drug management.        Final diagnoses:  Acute pustular eruption of skin    ED Discharge Orders          Ordered    sulfamethoxazole -trimethoprim  (BACTRIM  DS) 800-160 MG tablet  2 times daily        05/27/24 1510               Ramani Riva, PA-C 05/27/24 1520    Suzette Pac, MD 05/27/24 1750  "
